# Patient Record
Sex: Male | Born: 1980 | Hispanic: Yes | Marital: Single
Health system: Southern US, Community
[De-identification: ages and names within clinical notes are randomized; demographics above are authoritative.]

## PROBLEM LIST (undated history)

## (undated) DIAGNOSIS — G43909 Migraine, unspecified, not intractable, without status migrainosus: Secondary | ICD-10-CM

## (undated) HISTORY — DX: Migraine, unspecified, not intractable, without status migrainosus: G43.909

---

## 2016-02-24 DIAGNOSIS — K2970 Gastritis, unspecified, without bleeding: Secondary | ICD-10-CM | POA: Diagnosis not present

## 2016-02-24 DIAGNOSIS — J069 Acute upper respiratory infection, unspecified: Secondary | ICD-10-CM | POA: Diagnosis not present

## 2016-02-24 DIAGNOSIS — E559 Vitamin D deficiency, unspecified: Secondary | ICD-10-CM | POA: Diagnosis not present

## 2016-07-07 DIAGNOSIS — J309 Allergic rhinitis, unspecified: Secondary | ICD-10-CM | POA: Diagnosis not present

## 2016-07-07 DIAGNOSIS — Z8669 Personal history of other diseases of the nervous system and sense organs: Secondary | ICD-10-CM | POA: Diagnosis not present

## 2016-07-07 DIAGNOSIS — H1033 Unspecified acute conjunctivitis, bilateral: Secondary | ICD-10-CM | POA: Diagnosis not present

## 2016-07-20 DIAGNOSIS — M545 Low back pain: Secondary | ICD-10-CM | POA: Diagnosis not present

## 2016-07-20 DIAGNOSIS — Z0000 Encounter for general adult medical examination without abnormal findings: Secondary | ICD-10-CM | POA: Diagnosis not present

## 2016-07-20 DIAGNOSIS — M7631 Iliotibial band syndrome, right leg: Secondary | ICD-10-CM | POA: Diagnosis not present

## 2016-07-20 DIAGNOSIS — G8929 Other chronic pain: Secondary | ICD-10-CM | POA: Diagnosis not present

## 2017-05-03 ENCOUNTER — Ambulatory Visit (INDEPENDENT_AMBULATORY_CARE_PROVIDER_SITE_OTHER): Payer: BLUE CROSS/BLUE SHIELD

## 2017-05-03 ENCOUNTER — Encounter: Payer: Self-pay | Admitting: Sports Medicine

## 2017-05-03 ENCOUNTER — Ambulatory Visit (INDEPENDENT_AMBULATORY_CARE_PROVIDER_SITE_OTHER): Payer: BLUE CROSS/BLUE SHIELD | Admitting: Sports Medicine

## 2017-05-03 DIAGNOSIS — M5136 Other intervertebral disc degeneration, lumbar region: Secondary | ICD-10-CM

## 2017-05-03 DIAGNOSIS — M4807 Spinal stenosis, lumbosacral region: Secondary | ICD-10-CM | POA: Diagnosis not present

## 2017-05-03 DIAGNOSIS — M51369 Other intervertebral disc degeneration, lumbar region without mention of lumbar back pain or lower extremity pain: Secondary | ICD-10-CM | POA: Insufficient documentation

## 2017-05-03 DIAGNOSIS — K429 Umbilical hernia without obstruction or gangrene: Secondary | ICD-10-CM

## 2017-05-03 MED ORDER — PREDNISONE 50 MG PO TABS: ORAL_TABLET | ORAL | 0 refills | Status: DC

## 2017-05-03 MED ORDER — MELOXICAM 15 MG PO TABS: ORAL_TABLET | ORAL | 3 refills | Status: DC

## 2017-05-03 NOTE — Assessment & Plan Note (Signed)
Mostly axial discogenic back pain with occasional right sided L3 and L4 radicular symptoms, he does have some weakness to the right patellar reflex. X-rays, prednisone, meloxicam, physical therapy. Return to see me in 6 weeks.

## 2017-05-03 NOTE — Progress Notes (Signed)
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  Mild weakness.  No bowel or bladder dysfunction, saddle numbness, constitutional symptoms.  Has not had any treatment, or imaging.  I reviewed the past medical history, family history, social history, surgical history, and allergies today and no changes were needed.  Please see the problem list section below in epic for further details.  Past Medical History: Past Medical History:  Diagnosis Date  . Migraines    Past Surgical History: History reviewed. No pertinent surgical history. Social History: Social History   Socioeconomic History  . Marital status: Single    Spouse name: None  . Number of children: None  . Years of education: None  . Highest education level: None  Social Needs  . Financial resource strain: None  . Food insecurity - worry: None  . Food insecurity - inability: None  . Transportation needs - medical: None  . Transportation needs - non-medical: None  Occupational History  . None  Tobacco Use  . Smoking status: Never Smoker  . Smokeless tobacco: Never Used  Substance and Sexual Activity  . Alcohol use: No    Frequency: Never  . Drug use: No  . Sexual activity: None  Other Topics Concern  . None  Social History Narrative  . None   Family History: No family history on file. Allergies: No Known Allergies Medications: See med rec.  Review of Systems: No headache, visual changes, nausea, vomiting, diarrhea, constipation, dizziness, abdominal pain, skin rash, fevers, chills, night sweats, swollen lymph nodes, weight loss, chest pain, body aches, joint swelling, muscle aches, shortness of breath, mood changes, visual or auditory hallucinations.  Objective:    General: Well Developed, well  nourished, and in no acute distress.  Neuro: Alert and oriented x3, extra-ocular muscles intact, sensation grossly intact.  HEENT: Normocephalic, atraumatic, pupils equal round reactive to light, neck supple, no masses, no lymphadenopathy, thyroid nonpalpable.  Skin: Warm and dry, no rashes noted.  Cardiac: Regular rate and rhythm, no murmurs rubs or gallops.  Respiratory: Clear to auscultation bilaterally. Not using accessory muscles, speaking in full sentences.  Abdominal: Soft, nontender, nondistended, positive bowel sounds, minimally tender umbilical hernia at the superior umbilicus at the midline, no organomegaly.  Back Exam:  Inspection: Unremarkable  Motion: Flexion 45 deg, Extension 45 deg, Side Bending to 45 deg bilaterally,  Rotation to 45 deg bilaterally  SLR laying: Negative  XSLR laying: Negative  Palpable tenderness: None. FABER: negative. Sensory change: Gross sensation intact to all lumbar and sacral dermatomes.  Reflexes: 1+ at right patellar tendon, 2+ left patellar tendon, 2+ at achilles tendons, Babinski's downgoing.  Strength at foot  Plantar-flexion: 5/5 Dorsi-flexion: 5/5 Eversion: 5/5 Inversion: 5/5  Leg strength  Quad: 5/5 Hamstring: 5/5 Hip flexor: 5/5 Hip abductors: 5/5  Gait unremarkable.  Lumbar spine x-ray reviewed, minimal endplate degenerative changes but that is about it.  Impression and Recommendations:    The patient was counselled, risk factors were discussed, anticipatory guidance given.  Umbilical hernia Referral to general surgery.  Lumbar degenerative disc disease Mostly axial discogenic back pain with occasional right sided L3 and L4 radicular symptoms, he does have some weakness to the right patellar reflex. X-rays, prednisone, meloxicam, physical therapy. Return to see me in 6 weeks. ___________________________________________  Ihor Austinhomas J. Benjamin Stainhekkekandam, M.D., ABFM., CAQSM. Primary Care and Sports Medicine [facility]  [facility]  Adjunct Instructor of Family Medicine  [employer]

## 2017-05-03 NOTE — Assessment & Plan Note (Signed)
Referral to general surgery

## 2017-05-06 ENCOUNTER — Telehealth: Payer: Self-pay | Admitting: Sports Medicine

## 2017-05-06 NOTE — Telephone Encounter (Signed)
Odd, he can see me in the office and we can figure this out rather than trying to do it over the phone...with an interpreter.

## 2017-05-06 NOTE — Telephone Encounter (Signed)
Pt had a friend call clinic as a Nurse, learning disabilitytranslator for him to advise he is having an allergic reaction to his new medications. He reports having the hiccups. He has taken both the Prednisone and Meloxicam so unsure which one could be causing this. He now is refusing to take either one.

## 2017-05-09 NOTE — Telephone Encounter (Signed)
Called pt - informed of provider's note, has agreed to make an appt for further evaluation. Pt transferred to FD for scheduling.

## 2017-05-11 ENCOUNTER — Ambulatory Visit: Payer: BLUE CROSS/BLUE SHIELD | Admitting: Rehabilitative and Restorative Service Providers"

## 2017-05-11 ENCOUNTER — Encounter: Payer: Self-pay | Admitting: Rehabilitative and Restorative Service Providers"

## 2017-05-11 DIAGNOSIS — M545 Low back pain: Secondary | ICD-10-CM

## 2017-05-11 DIAGNOSIS — R29898 Other symptoms and signs involving the musculoskeletal system: Secondary | ICD-10-CM

## 2017-05-11 DIAGNOSIS — G8929 Other chronic pain: Secondary | ICD-10-CM

## 2017-05-11 NOTE — Therapy (Signed)
[facility] [address]  [address] [place], [place], [address] Phone: [phone]   Fax:  [phone]  Physical Therapy Evaluation  Patient Details  Name: Dustin Rodgers MRN: [id number] Date of Birth: December 01, 1980 Referring Provider: Dr Benjamin Stain   Encounter Date: 05/11/2017  PT End of Session - 05/11/17 1113    Visit Number  1    Number of Visits  12    Date for PT Re-Evaluation  06/22/17    PT Start Time  1017    PT Stop Time  1120    PT Time Calculation (min)  63 min    Activity Tolerance  Patient tolerated treatment well       Past Medical History:  Diagnosis Date  . Migraines     History reviewed. No pertinent surgical history.  There were no vitals filed for this visit.   Subjective Assessment - 05/11/17 1019    Subjective  Patient reports that he started having painin the Rt hip and LB which has gradually increased over the past 3 years. He reports that symptoms are now "unbearable". He has difficulty driving to work and perfroming normal functional activities and decided to do something about the pain.     Patient is accompained by:  Interpreter [employer] interpreter service     Pertinent History  Injury to LB playing football ~ 3 yrs ago; migraine HA's ~ 5 yrs every few weeks      How long can you sit comfortably?  varies 30 min - increased with pain with driving 1-2 hours     How long can you stand comfortably?  2 hours     How long can you walk comfortably?  45 min - Rt LE swelling - running 20 min followed by walking increases LBP     Patient Stated Goals  get rid of LBP and increase activity without pain. To be able to sit to drive to work. To be able to sit.     Currently in Pain?  Yes    Pain Score  7     Pain Location  Back    Pain Orientation  Right;Lower    Pain Type  Chronic pain    Pain Radiating Towards  Rt LE to knee - at times     Pain Onset  More than a month ago    Pain Frequency  Intermittent     Aggravating Factors   prolonged sitting; prolonged standing, walking or running     Pain Relieving Factors  lying down on the floor          [facility] PT Assessment - 05/11/17 0001      Assessment   Medical Diagnosis  LBP     Referring Provider  Dr Benjamin Stain    Onset Date/Surgical Date  04/12/14    Hand Dominance  Right    Next MD Visit  to schedule     Prior Therapy  chiropractic care x 3 weeks       Precautions   Precautions  None      Balance Screen   Has the patient fallen in the past 6 months  No    Has the patient had a decrease in activity level because of a fear of falling?   No    Is the patient reluctant to leave their home because of a fear of falling?   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, bending, standing     Leisure  gym - less consistent in the past few weeks/months       Observation/Other Assessments   Focus on Therapeutic Outcomes (FOTO)   37% limitation       Posture/Postural Control   Posture Comments  head forward; shoulders rounded       AROM   Right/Left Hip  -- end range tightness bilat hips Rt > Lt     Lumbar Flexion  85%    Lumbar Extension  60% some discomfort     Lumbar - Right Side Bend  80%    Lumbar - Left Side Bend  80%    Lumbar - Right Rotation  60%    Lumbar - Left Rotation  50% pulling discomfort Rt LB area       Strength   Overall Strength Comments  5/5 bilat LE's       Flexibility   Hamstrings  tight Rt ~ 70 deg; Lt 75 deg     Quadriceps  tight Rt > Lt     ITB  tight Rt     Piriformis  tight Rt > Lt       Palpation   Spinal mobility  pain with CPA and Rt lateral mobs ~ L3     Palpation comment  muscular tightness Rt lumbar paraspinals and hip musculature including piriformis and gluts              Objective measurements completed on examination: See above findings.      [facility] Adult  PT Treatment/Exercise - 05/11/17 0001      Lumbar Exercises: Stretches   Passive Hamstring Stretch  2 reps;30 seconds supine with strap     Double Knee to Chest Stretch  3 reps;30 seconds supine       Moist Heat Therapy   Number Minutes Moist Heat  20 Minutes    Moist Heat Location  Lumbar Spine      Electrical Stimulation   Electrical Stimulation Location  Rt lumbar to Rt hip     Electrical Stimulation Action  IFC    Electrical Stimulation Parameters  to tolerance    Electrical Stimulation Goals  Tone;Pain             PT Education - 05/11/17 1111    Education provided  Yes    Education Details  HEP TENS     Person(s) Educated  Patient    Methods  Explanation;Demonstration;Tactile cues;Verbal cues;Handout    Comprehension  Verbalized understanding;Returned demonstration;Verbal cues required;Tactile cues required          PT Long Term Goals - 05/11/17 1137      PT LONG TERM GOAL #1   Title  Increase lumbar and LE mobilty and ROM to WFL's and painfree 06/22/17    Time  6    Period  Weeks    Status  New      PT LONG TERM GOAL #2   Title  Decrease pain with functional activities including driving and walking to no more than 1-2/10 per pt report 06/22/17    Time  6    Period  Weeks    Status  New      PT LONG TERM GOAL #3   Title  Patient reports returning to gym for aerobic and strength training with safe program for LB 06/22/17  Time  6    Period  Weeks    Status  New      PT LONG TERM GOAL #4   Title  Independent in HEP 06/22/17    Time  6    Period  Weeks    Status  New      PT LONG TERM GOAL #5   Title  Improve FOTO to </= 30% limitation 313/19    Time  6    Period  Weeks    Status  New             Plan - 05/11/17 1132    Clinical Impression Statement  Devaunte presents with a 3 year history of Rt hip and Rt LBP with symtpoms gradually increasing in the past few weeks/months. He has poor posture and alignment; limited trunk and LE mobility Rt  > Lt; muscular tightness to palpation Rt lumbar and posterior hip musculature; pain and limited functional abilities. {atient will benefit from PT to address problems identified.     History and Personal Factors relevant to plan of care:  Lt with ORIF Lt femur at 37 yr old     Clinical Presentation  Stable    Clinical Decision Making  Low    Rehab Potential  Good    Clinical Impairments Affecting Rehab Potential  chronic nature of symptoms     PT Frequency  2x / week    PT Duration  6 weeks    PT Treatment/Interventions  Patient/family education;ADLs/Self Care Home Management;Cryotherapy;Electrical Stimulation;Iontophoresis 4mg /ml Dexamethasone;Moist Heat;Traction;Ultrasound;Dry needling;Manual techniques;Neuromuscular re-education;Therapeutic activities;Therapeutic exercise    PT Next Visit Plan  review HEP; add stretches for hip flexors; IT band; piriformis. Deep tissue work vs DN; back care education; modalities as inbdicated     Consulted and Agree with Plan of Care  Patient       Patient will benefit from skilled therapeutic intervention in order to improve the following deficits and impairments:  Postural dysfunction, Improper body mechanics, Pain, Increased fascial restricitons, Increased muscle spasms, Decreased mobility, Decreased range of motion, Impaired UE functional use, Decreased activity tolerance  Visit Diagnosis: Chronic right-sided low back pain, with sciatica presence unspecified - Plan: PT plan of care cert/re-cert  Other symptoms and signs involving the musculoskeletal system - Plan: PT plan of care cert/re-cert     Problem List Patient Active Problem List   Diagnosis Date Noted  . Lumbar degenerative disc disease 05/03/2017  . Umbilical hernia 05/03/2017    Namiko Pritts Rober Minion PT, MPH  05/11/2017, 11:42 AM  [facility] [address] [place] [address] [place], [place], [address] Phone: [phone]   Fax:  [phone]  Name:  Seymour Pavlak MRN: [id number] Date of Birth: 1981/02/25

## 2017-05-11 NOTE — Patient Instructions (Signed)
Knee-to-Chest Stretch: Bilateral    Con las manos detrs de las rodillas, llvelas hacia el pecho hasta sentir un estiramiento cmodo en la espalada baja y los glteos. Mantenga la espalda relajada. Sostenga __30__ segundos. Repita __3__ Anthoney Haradaveces por rutina.  Realice __2__ sesiones por da.   Hamstring: Towel Stretch (Supine)    Acostado boca arriba. Coloque una toalla alrededor del pie izquierdo, flexione la rodilla y cadera a 90. Extienda la rodilla y Calpine Corporationhale el pie hacia el cuerpo. Sostenga _30__ segundos. Reljese. Repita __3_ veces. Realice _2__ veces al da. Repita con la otra pierna.  TENS UNIT: This is helpful for muscle pain and spasm.   Search and Purchase a TENS 7000 2nd edition at www.tenspros.com. It should be less than $30.     TENS unit instructions: Do not shower or bathe with the unit on Turn the unit off before removing electrodes or batteries If the electrodes lose stickiness add a drop of water to the electrodes after they are disconnected from the unit and place on plastic sheet. If you continued to have difficulty, call the TENS unit company to purchase more electrodes. Do not apply lotion on the skin area prior to use. Make sure the skin is clean and dry as this will help prolong the life of the electrodes. After use, always check skin for unusual red areas, rash or other skin difficulties. If there are any skin problems, does not apply electrodes to the same area. Never remove the electrodes from the unit by pulling the wires. Do not use the TENS unit or electrodes other than as directed. Do not change electrode placement without consultating your therapist or physician. Keep 2 fingers with between each electrode.

## 2017-05-17 ENCOUNTER — Encounter: Payer: BLUE CROSS/BLUE SHIELD | Admitting: Rehabilitative and Restorative Service Providers"

## 2017-05-17 ENCOUNTER — Ambulatory Visit (INDEPENDENT_AMBULATORY_CARE_PROVIDER_SITE_OTHER): Payer: BLUE CROSS/BLUE SHIELD | Admitting: Sports Medicine

## 2017-05-17 ENCOUNTER — Encounter: Payer: Self-pay | Admitting: Sports Medicine

## 2017-05-17 DIAGNOSIS — M5416 Radiculopathy, lumbar region: Secondary | ICD-10-CM | POA: Diagnosis not present

## 2017-05-17 MED ORDER — CELECOXIB 200 MG PO CAPS: ORAL_CAPSULE | ORAL | 2 refills | Status: AC

## 2017-05-17 NOTE — Assessment & Plan Note (Signed)
With occasional right sided L3 and L4 radicular symptoms. Did develop some dyspepsia with the prednisone and meloxicam. Discontinue these, switching to Celebrex, he will do this with meals, he will also continue to work hard with physical therapy over the next month and return to see me afterwards. He did tell me his back pain was gone on the day or 2 he was taking prednisone.

## 2017-05-17 NOTE — Progress Notes (Signed)
  Subjective:    CC: Follow-up  HPI: This is a pleasant 37 year old male, he was having right L3 and L4 lumbar radiculitis, x-rays showed multilevel lumbar degenerative disc disease.  We started prednisone, meloxicam, physical therapy, initially he had a good response with regards to his back pain but did develop some epigastric discomfort and hiccups, no melena, hematochezia, hematemesis.  He stopped the meloxicam and the prednisone.  Continuing to do PT, he has had a single session tells me he felt good during and afterwards.  I reviewed the past medical history, family history, social history, surgical history, and allergies today and no changes were needed.  Please see the problem list section below in epic for further details.  Past Medical History: Past Medical History:  Diagnosis Date  . Migraines    Past Surgical History: No past surgical history on file. Social History: Social History   Socioeconomic History  . Marital status: Single    Spouse name: None  . Number of children: None  . Years of education: None  . Highest education level: None  Social Needs  . Financial resource strain: None  . Food insecurity - worry: None  . Food insecurity - inability: None  . Transportation needs - medical: None  . Transportation needs - non-medical: None  Occupational History  . None  Tobacco Use  . Smoking status: Never Smoker  . Smokeless tobacco: Never Used  Substance and Sexual Activity  . Alcohol use: No    Frequency: Never  . Drug use: No  . Sexual activity: None  Other Topics Concern  . None  Social History Narrative  . None   Family History: No family history on file. Allergies: No Known Allergies Medications: See med rec.  Review of Systems: No fevers, chills, night sweats, weight loss, chest pain, or shortness of breath.   Objective:    General: Well Developed, well nourished, and in no acute distress.  Neuro: Alert and oriented x3, extra-ocular muscles  intact, sensation grossly intact.  HEENT: Normocephalic, atraumatic, pupils equal round reactive to light, neck supple, no masses, no lymphadenopathy, thyroid nonpalpable.  Skin: Warm and dry, no rashes. Cardiac: Regular rate and rhythm, no murmurs rubs or gallops, no lower extremity edema.  Respiratory: Clear to auscultation bilaterally. Not using accessory muscles, speaking in full sentences.  Impression and Recommendations:    Lumbar radiculopathy With occasional right sided L3 and L4 radicular symptoms. Did develop some dyspepsia with the prednisone and meloxicam. Discontinue these, switching to Celebrex, he will do this with meals, he will also continue to work hard with physical therapy over the next month and return to see me afterwards. He did tell me his back pain was gone on the day or 2 he was taking prednisone.  Interview conducted with video interpreter.  I spent 25 minutes with this patient, greater than 50% was face-to-face time counseling regarding the above diagnoses ___________________________________________ Ihor Austinhomas J. Benjamin Stainhekkekandam, M.D., ABFM., CAQSM. Primary Care and Sports Medicine  [facility]  Adjunct Instructor of Family Medicine  [employer]

## 2017-05-18 ENCOUNTER — Ambulatory Visit: Payer: BLUE CROSS/BLUE SHIELD | Admitting: Physical Therapy

## 2017-05-18 DIAGNOSIS — G8929 Other chronic pain: Secondary | ICD-10-CM | POA: Diagnosis not present

## 2017-05-18 DIAGNOSIS — R29898 Other symptoms and signs involving the musculoskeletal system: Secondary | ICD-10-CM

## 2017-05-18 DIAGNOSIS — M545 Low back pain: Secondary | ICD-10-CM | POA: Diagnosis not present

## 2017-05-18 NOTE — Therapy (Signed)
[facility] [place] [address] [place] [address] [place], [place], [address] Phone: [phone]   Fax:  [phone]  Physical Therapy Treatment  Patient Details  Name: Dustin Rodgers MRN: [id number] Date of Birth: 06/07/1980 Referring Provider: Dr. Benjamin Stainhekkekandam   Encounter Date: 05/18/2017  PT End of Session - 05/18/17 1034    Visit Number  2    Number of Visits  12    Date for PT Re-Evaluation  06/22/17    PT Start Time  1030    PT Stop Time  1132    PT Time Calculation (min)  62 min    Activity Tolerance  Patient tolerated treatment well;No increased pain    Behavior During Therapy  WFL for tasks assessed/performed       Past Medical History:  Diagnosis Date  . Migraines     No past surgical history on file.  There were no vitals filed for this visit.  Subjective Assessment - 05/18/17 1034    Subjective  Pt reports he has been doing stretches every day.  He is able to move his Rt leg more with stretches. He feels less pain/numbness in RLE after stretch.     Patient is accompained by:  Interpreter Myrna [phone]    Patient Stated Goals  get rid of LBP and increase activity without pain. To be able to sit to drive to work. To be able to sit.     Currently in Pain?  Yes    Pain Score  4     Pain Location  Back    Pain Orientation  Right;Lower    Pain Descriptors / Indicators  Aching         [facility] PT Assessment - 05/18/17 0001      Assessment   Medical Diagnosis  LBP     Referring Provider  Dr. Benjamin Stainhekkekandam    Onset Date/Surgical Date  04/12/14    Hand Dominance  Right    Next MD Visit  to schedule       Flexibility   Hamstrings  Rt 90 deg.        [facility] Adult PT Treatment/Exercise - 05/18/17 0001      Lumbar Exercises: Stretches   Passive Hamstring Stretch  2 reps;60 seconds supine with strap     Double Knee to Chest Stretch  2 reps;30 seconds    Standing Extension  2 reps;10 reps    Prone on Elbows Stretch  1 rep;60  seconds    Press Ups  5 reps;5 seconds    Piriformis Stretch  Right;Left;2 reps;30 seconds    Figure 4 Stretch  30 seconds;1 rep;With overpressure;Supine      Lumbar Exercises: Aerobic   Nustep  L5:4 min       Lumbar Exercises: Standing   Other Standing Lumbar Exercises  box lift from shoulder height shelf to waist high, core engaged, VC on form/technique x 2 reps.       Lumbar Exercises: Seated   Sit to Stand  10 reps core engaged.     Other Seated Lumbar Exercises  Lap press with core engaged x 5 sec hold x 10 reps; tactile cues to engage.       Moist Heat Therapy   Number Minutes Moist Heat  20 Minutes    Moist Heat Location  Lumbar Spine      Electrical Stimulation   Electrical Stimulation Location  Rt lumbar to Rt hip    Electrical Stimulation Action  IFC  Electrical Stimulation Parameters  to tolerance     Electrical Stimulation Goals  Pain             PT Education - 05/18/17 1059    Education provided  Yes    Education Details  Posture and body mechanics; added prone on elbows to HEP.     Person(s) Educated  Patient    Methods  Explanation;Handout    Comprehension  Verbalized understanding          PT Long Term Goals - 05/18/17 1126      PT LONG TERM GOAL #1   Title  Increase lumbar and LE mobilty and ROM to WFL's and painfree 06/22/17    Time  6    Period  Weeks    Status  On-going      PT LONG TERM GOAL #2   Title  Decrease pain with functional activities including driving and walking to no more than 1-2/10 per pt report 06/22/17    Time  6    Period  Weeks    Status  On-going      PT LONG TERM GOAL #3   Title  Patient reports returning to gym for aerobic and strength training with safe program for LB 06/22/17    Time  6    Period  Weeks    Status  On-going      PT LONG TERM GOAL #4   Title  Independent in HEP 06/22/17    Time  6    Period  Weeks    Status  On-going      PT LONG TERM GOAL #5   Title  Improve FOTO to </= 30% limitation  313/19    Time  6    Period  Weeks    Status  On-going            Plan - 05/18/17 1127    Clinical Impression Statement  Pt reporting decreased radicular symptoms with hamstring stretches.  He reported increased pain with knees to chest and fig 4 hip stretch, reduced with prone press ups.  Pt educated on core engagement with lifting / bending at work; returned demo and reported decrease in LBP.  Encouraged pt to hold off on gym exercise (especially running and LE weights) until back symptoms reduced.  Pt progressing towards therapy goals.     PT Frequency  2x / week    PT Duration  6 weeks    PT Treatment/Interventions  Patient/family education;ADLs/Self Care Home Management;Cryotherapy;Electrical Stimulation;Iontophoresis 4mg /ml Dexamethasone;Moist Heat;Traction;Ultrasound;Dry needling;Manual techniques;Neuromuscular re-education;Therapeutic activities;Therapeutic exercise    PT Next Visit Plan  continue core strengthening and LE stretching.  Manual therapy to hips/low back.     Consulted and Agree with Plan of Care  Patient       Patient will benefit from skilled therapeutic intervention in order to improve the following deficits and impairments:  Postural dysfunction, Improper body mechanics, Pain, Increased fascial restricitons, Increased muscle spasms, Decreased mobility, Decreased range of motion, Impaired UE functional use, Decreased activity tolerance  Visit Diagnosis: Chronic right-sided low back pain, with sciatica presence unspecified  Other symptoms and signs involving the musculoskeletal system     Problem List Patient Active Problem List   Diagnosis Date Noted  . Lumbar radiculopathy 05/17/2017  . Umbilical hernia 05/03/2017   Mayer Camel, PTA 05/18/17 11:32 AM  [facility] [place] [address] [place], [place], [address] Phone: [phone]   Fax:  [phone]  Name: Dustin  Bradly Rodgers MRN:  [id number] Date of Birth: [birth year]

## 2017-05-18 NOTE — Patient Instructions (Addendum)
Recuerda apretar tus abdominales cuando ests de pie o levantando objetos.  On Elbows (Prone)    Rise up on elbows as high as possible, keeping hips on floor. Hold __3__ seconds. Repeat _5___ times per set.Do __2__ sessions per day.    Sleeping on Back    Coloque una almohada debajo de las rodillas. Dustin GilmoreUna almohada con soporte cervical y un rollo alrededor de la cintura son tambin de [place]gran ayuda.   Sleeping on Side    Coloque una PepsiCoalmohada entre las rodillas. Utilice soporte cervical abajo del cuello y un rollo alrededor de la cintura si lo necesita.  Sleeping on Stomach    Si sta es la nica posicin en la que puede dormir, coloque una almohada debajo de la parte inferior de las piernas y otra debajo del estmago o del pecho segn necesidad.  Posture Awareness    Prese y chequee la postura: Saque el mentn, llvelo [place] atrs, muvalo hacia una posicin media confortable. Deslice la pelvis hacia adelante, hacia atrs, asegrese de que la espalda no est arqueada. Desplace el peso desde los General Electrictalones hacia las puntas de los pies, luego distribuya el peso parejo. Imagnese una lnea que le atraviesa la columna y tira [place] arriba, enderezndolo. Concntrese en la respiracin. Buena postura = Mejor respiracin.   Posture - Sitting    Sintese derecho, con la cabeza mirando hacia adelante. Intente colocar un rollo detrs de la cintura para sostener la columna lumbar. Mantenga los hombros relajados, evite redondear la espalda. Mantenga las caderas niveladas con las rodillas. Evite cruzar las piernas por perodos prolongados.

## 2017-05-25 ENCOUNTER — Ambulatory Visit: Payer: BLUE CROSS/BLUE SHIELD | Admitting: Physical Therapy

## 2017-05-25 DIAGNOSIS — M545 Low back pain: Secondary | ICD-10-CM | POA: Diagnosis not present

## 2017-05-25 DIAGNOSIS — R29898 Other symptoms and signs involving the musculoskeletal system: Secondary | ICD-10-CM

## 2017-05-25 DIAGNOSIS — G8929 Other chronic pain: Secondary | ICD-10-CM

## 2017-05-25 NOTE — Patient Instructions (Signed)
  Knee Fold    De espaldas, con las piernas dobladas y los brazos a los lados. Espire, levantando la rodilla Tech Data Corporationhacia el pecho. Inspire al regresar. Mantenga los abdominales planos y el ombligo hacia la columna. Repita _10__ veces, alternando las piernas. Haga _1-2__ sesiones diarias.  Arm / Leg Lift: Opposite (Prone)    Eleve la pierna izquierda y el brazo opuesto __2__ cm. del piso, mantenga la rodilla extendida. Repita __10__ veces por rutina. Realice _2___ Carlis Stablerutinas por sesin. Realice _1___ sesiones por da.   Bridge    [place] espaldas, piernas dobladas. Inspire presionando con las caderas [place]acia arriba. Mantenga las Reynolds Americancostillas hacia adentro, extienda la espalda inferior. Espire rodando Intelsobre la columna desde Seychellesarriba. Repita _10__ veces. Haga _1-2__ sesiones diarias.    [facility] [address] [place], [place] [address]  [phone] (office) [phone] (fax)

## 2017-05-25 NOTE — Therapy (Signed)
[facility] [place] [address] [place] [address] [place], [place], [address] Phone: [phone]   Fax:  [phone]  Physical Therapy Treatment  Patient Details  Name: Daimon Kean MRN: [id number] Date of Birth: 12-29-1980 Referring Provider: Dr. Benjamin Stain   Encounter Date: 05/25/2017  PT End of Session - 05/25/17 1023    Visit Number  3    Number of Visits  12    Date for PT Re-Evaluation  06/22/17    PT Start Time  1018    PT Stop Time  1113    PT Time Calculation (min)  55 min    Activity Tolerance  Patient tolerated treatment well    Behavior During Therapy  [facility] for tasks assessed/performed       Past Medical History:  Diagnosis Date  . Migraines     No past surgical history on file.  There were no vitals filed for this visit.  Subjective Assessment - 05/25/17 1024    Subjective  Pt reports he is feeling a little better. He has been stretching 2x/day, using Ice and taking medicine.  He has been tightening his stomach with work activities (lifting/ bending).  He has held off on running or lifting at gym.    Currently in Pain?  Yes    Pain Score  3     Pain Location  Back    Pain Orientation  Right    Pain Descriptors / Indicators  Aching    Pain Radiating Towards  Rt LE to knee, sometimes lateral foot.     Aggravating Factors   lifting, squating, prolonged sitting    Pain Relieving Factors  medicine, stretches         [facility] PT Assessment - 05/25/17 0001      Assessment   Medical Diagnosis  LBP     Referring Provider  Dr. Benjamin Stain    Onset Date/Surgical Date  04/12/14    Hand Dominance  Right    Next MD Visit  to schedule       Flexibility   Hamstrings  Rt 87, Lt 90 deg.                   [facility] Adult PT Treatment/Exercise - 05/25/17 0001      Self-Care   Self-Care  Other Self-Care Comments    Other Self-Care Comments   Pt educated on TENS safety parameters and application; pt verbalized  understanding.       Lumbar Exercises: Stretches   Passive Hamstring Stretch  60 seconds;3 reps supine with strap     Press Ups  5 seconds;10 reps    Piriformis Stretch  Right;Left;30 seconds;3 reps    Figure 4 Stretch  2 reps;20 seconds      Lumbar Exercises: Aerobic   Nustep  L5:5 min  PTA present to discuss progress      Lumbar Exercises: Supine   Ab Set  5 reps;5 seconds    Bent Knee Raise  10 reps with ab set; multiple cues to engage.     Bridge  10 reps;3 seconds core engaged.       Lumbar Exercises: Prone   Opposite Arm/Leg Raise  Right arm/Left leg;Left arm/Right leg;10 reps;2 seconds      Moist Heat Therapy   Number Minutes Moist Heat  15 Minutes    Moist Heat Location  Lumbar Spine      Electrical Stimulation   Electrical Stimulation Location  Rt lumbar to Rt hip  Electrical Stimulation Action  IFC    Electrical Stimulation Parameters  to tolerance     Electrical Stimulation Goals  Pain;Tone             PT Education - 05/25/17 1235    Education provided  Yes    Education Details  HEP    Person(s) Educated  Patient    Methods  Explanation;Handout;Verbal cues;Tactile cues;Demonstration    Comprehension  Verbalized understanding          PT Long Term Goals - 05/25/17 1223      PT LONG TERM GOAL #1   Title  Increase lumbar and LE mobilty and ROM to WFL's and painfree 06/22/17    Period  Weeks    Status  On-going improving      PT LONG TERM GOAL #2   Title  Decrease pain with functional activities including driving and walking to no more than 1-2/10 per pt report 06/22/17    Time  6    Period  Weeks    Status  On-going up to 4-5/10 with functional activities      PT LONG TERM GOAL #3   Title  Patient reports returning to gym for aerobic and strength training with safe program for LB 06/22/17    Time  6    Period  Weeks    Status  On-going      PT LONG TERM GOAL #4   Title  Independent in HEP 06/22/17    Time  6    Period  Weeks    Status   On-going      PT LONG TERM GOAL #5   Title  Improve FOTO to </= 30% limitation 313/19    Time  6    Period  Weeks    Status  On-going            Plan - 05/25/17 1231    Clinical Impression Statement  Pt reporting overall decrease in pain in back.  Positive response with stretches and heat thus far.  He tolerated all exercises without increase in pain. Progressing well towards stated therapy goals.     Rehab Potential  Good    PT Frequency  2x / week    PT Duration  6 weeks    PT Treatment/Interventions  Patient/family education;ADLs/Self Care Home Management;Cryotherapy;Electrical Stimulation;Iontophoresis 4mg /ml Dexamethasone;Moist Heat;Traction;Ultrasound;Dry needling;Manual techniques;Neuromuscular re-education;Therapeutic activities;Therapeutic exercise    PT Next Visit Plan  continue core strengthening and LE stretching.  Manual therapy to hips/low back if indicated.   educate on self massage techniques.     Consulted and Agree with Plan of Care  Patient       Patient will benefit from skilled therapeutic intervention in order to improve the following deficits and impairments:  Postural dysfunction, Improper body mechanics, Pain, Increased fascial restricitons, Increased muscle spasms, Decreased mobility, Decreased range of motion, Impaired UE functional use, Decreased activity tolerance  Visit Diagnosis: Chronic right-sided low back pain, with sciatica presence unspecified  Other symptoms and signs involving the musculoskeletal system     Problem List Patient Active Problem List   Diagnosis Date Noted  . Lumbar radiculopathy 05/17/2017  . Umbilical hernia 05/03/2017   Mayer CamelJennifer Carlson-Long, PTA 05/25/17 12:35 PM  [facility] [place] [address] [place] [address] [place], [place], [address] Phone: [phone]   Fax:  [phone]  Name: Debbra RidingHoracio Martinez MRN: [id number] Date of Birth: 12/02/1980

## 2017-06-08 ENCOUNTER — Ambulatory Visit: Payer: BLUE CROSS/BLUE SHIELD | Admitting: Physical Therapy

## 2017-06-08 DIAGNOSIS — M545 Low back pain: Secondary | ICD-10-CM | POA: Diagnosis not present

## 2017-06-08 DIAGNOSIS — R29898 Other symptoms and signs involving the musculoskeletal system: Secondary | ICD-10-CM

## 2017-06-08 DIAGNOSIS — G8929 Other chronic pain: Secondary | ICD-10-CM | POA: Diagnosis not present

## 2017-06-08 NOTE — Patient Instructions (Addendum)
KNEE: Quadriceps - Prone    Place strap around ankle. Bring ankle toward buttocks. Press hip into surface. Hold __30_ seconds. _2__ reps per set, __1-2_ sets per day,  Sleeping on Back    Coloque una almohada debajo de las rodillas. Viona Gilmore con soporte cervical y un rollo alrededor de la cintura son tambin de [place].   Sleeping on Side    Coloque una PepsiCo. Utilice soporte cervical abajo del cuello y un rollo alrededor de la cintura si lo necesita.  Sleeping on Stomach    Si sta es la nica posicin en la que puede dormir, coloque una almohada debajo de la parte inferior de las piernas y otra debajo del estmago o del pecho segn necesidad.  Posture Awareness    Prese y chequee la postura: Saque el mentn, llvelo [place] atrs, muvalo hacia una posicin media confortable. Deslice la pelvis hacia adelante, hacia atrs, asegrese de que la espalda no est arqueada. Desplace el peso desde los [employer] puntas de los pies, luego distribuya el peso parejo. Imagnese una lnea que le atraviesa la columna y tira [place] arriba, enderezndolo. Concntrese en la respiracin. Buena postura = Mejor respiracin.   Posture - Sitting    Sintese derecho, con la cabeza mirando hacia adelante. Intente colocar un rollo detrs de la cintura para sostener la columna lumbar. Mantenga los hombros relajados, evite redondear la espalda. Mantenga las caderas niveladas con las rodillas. Evite cruzar las piernas por perodos prolongados.  Sleeping on Back  Place pillow under knees. A pillow with cervical support and a roll around waist are also helpful. Copyright  VHI. All rights reserved.  Sleeping on Side Place pillow between knees. Use cervical support under neck and a roll around waist as needed. Copyright  VHI. All rights reserved.   Sleeping on Stomach   If this is the only desirable sleeping position, place pillow under lower legs, and under  stomach or chest as needed.  Posture - Sitting   Sit upright, head facing forward. Try using a roll to support lower back. Keep shoulders relaxed, and avoid rounded back. Keep hips level with knees. Avoid crossing legs for long periods. Stand to Sit / Sit to Stand   To sit: Bend knees to lower self onto front edge of chair, then scoot back on seat. To stand: Reverse sequence by placing one foot forward, and scoot to front of seat. Use rocking motion to stand up.   Work Height and Reach  Ideal work height is no more than 2 to 4 inches below elbow level when standing, and at elbow level when sitting. Reaching should be limited to arm's length, with elbows slightly bent.  Bending  Bend at hips and knees, not back. Keep feet shoulder-width apart.    Posture - Standing   Good posture is important. Avoid slouching and forward head thrust. Maintain curve in low back and align ears over shoul- ders, hips over ankles.  Alternating Positions   Alternate tasks and change positions frequently to reduce fatigue and muscle tension. Take rest breaks. Computer Work   Position work to Art gallery manager. Use proper work and seat height. Keep shoulders back and down, wrists straight, and elbows at right angles. Use chair that provides full back support. Add footrest and lumbar roll as needed.  Getting Into / Out of Car  Lower self onto seat, scoot back, then bring in one leg at a time. Reverse sequence to get out.  Dressing  Lie on back to pull socks or slacks over feet, or sit and bend leg while keeping back straight.    Housework - Sink  Place one foot on ledge of cabinet under sink when standing at sink for prolonged periods.   Pushing / Pulling  Pushing is preferable to pulling. Keep back in proper alignment, and use leg muscles to do the work.  Deep Squat   Squat and lift with both arms held against upper trunk. Tighten stomach muscles without holding breath. Use smooth  movements to avoid jerking.  Avoid Twisting   Avoid twisting or bending back. Pivot around using foot movements, and bend at knees if needed when reaching for articles.  Carrying Luggage   Distribute weight evenly on both sides. Use a cart whenever possible. Do not twist trunk. Move body as a unit.   Lifting Principles .Maintain proper posture and head alignment. .Slide object as close as possible before lifting. .Move obstacles out of the way. .Test before lifting; ask for help if too heavy. .Tighten stomach muscles without holding breath. .Use smooth movements; do not jerk. .Use legs to do the work, and pivot with feet. .Distribute the work load symmetrically and close to the center of trunk. .Push instead of pull whenever possible.   Ask For Help   Ask for help and delegate to others when possible. Coordinate your movements when lifting together, and maintain the low back curve.  Log Roll   Lying on back, bend left knee and place left arm across chest. Roll all in one movement to the right. Reverse to roll to the left. Always move as one unit. Housework - Sweeping  Use long-handled equipment to avoid stooping.   Housework - Wiping  Position yourself as close as possible to reach work surface. Avoid straining your back.  Laundry - Unloading Wash   To unload small items at bottom of washer, lift leg opposite to arm being used to reach.  Gardening - Raking  Move close to area to be raked. Use arm movements to do the work. Keep back straight and avoid twisting.     Cart  When reaching into cart with one arm, lift opposite leg to keep back straight.   Getting Into / Out of Bed  Lower self to lie down on one side by raising legs and lowering head at the same time. Use arms to assist moving without twisting. Bend both knees to roll onto back if desired. To sit up, start from lying on side, and use same move-ments in reverse. Housework - Vacuuming  Hold the  vacuum with arm held at side. Step back and forth to move it, keeping head up. Avoid twisting.   Laundry - Armed forces training and education officerLoading Wash  Position laundry basket so that bending and twisting can be avoided.   Laundry - Unloading Dryer  Squat down to reach into clothes dryer or use a reacher.  Gardening - Weeding / Psychiatric nurselanting  Squat or Kneel. Knee pads may be helpful.                     [facility] [place] [address] [place] [address] [place], [place] [address]  [phone])

## 2017-06-08 NOTE — Therapy (Signed)
[place], [place], [address] Phone: [phone]   Fax:  [phone]  Physical Therapy Treatment  Patient Details  Name: Dustin Rodgers MRN: [id number] Date of Birth: [birth year] Referring Provider: Dr. Dianah Field   Encounter Date: 06/08/2017  PT End of Session - 06/08/17 1050    Visit Number  4    Number of Visits  12    Date for PT Re-Evaluation  06/22/17    PT Start Time  1020    PT Stop Time  1109    PT Time Calculation (min)  49 min    Activity Tolerance  Patient tolerated treatment well    Behavior During Therapy  [facility] for tasks assessed/performed       Past Medical History:  Diagnosis Date  . Migraines     No past surgical history on file.  There were no vitals filed for this visit.  Subjective Assessment - 06/08/17 1022    Subjective  Pt reports he has been using bike, treadmill  to walk, and recumbent bike. He had to lift heavy boxes at work on Monday and the pain returned.  He no longer has numbness in his leg.     Patient is accompained by:  Interpreter Tiburcio Bash    Currently in Pain?  Yes    Pain Score  4     Pain Location  Back    Pain Orientation  Right    Pain Descriptors / Indicators  Dull;Aching    Pain Radiating Towards  to Rt thigh    Aggravating Factors   lifting, squating     Pain Relieving Factors  medicine, ice.          [facility] PT Assessment - 06/08/17 0001      Assessment   Medical Diagnosis  LBP     Referring Provider  Dr. Dianah Field    Onset Date/Surgical Date  04/12/14    Hand Dominance  Right    Next MD Visit  to schedule       Palpation   Palpation comment  Rt ASIS lower than Lt.  Rt sacral torsion       [facility] Adult PT Treatment/Exercise - 06/08/17 0001      Self-Care   Other Self-Care Comments   pt educated on posture and body mechanics - issued handout also.       Lumbar Exercises: Stretches   Passive Hamstring Stretch   Right;Left;2 reps 45 seconds.     Quad Stretch  Right;Left;2 reps;30 seconds    Piriformis Stretch  Right;Left;30 seconds;3 reps      Lumbar Exercises: Aerobic   Recumbent Bike  L2: 5.5 min  PTA present to discuss progress      Lumbar Exercises: Standing   Other Standing Lumbar Exercises  squats with core engaged and dowel against back (for tactile cue of straight back) x 5 reps       Lumbar Exercises: Seated   Sit to Stand  5 reps core engaged.       Lumbar Exercises: Supine   Bridge  10 reps core engaged.       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               PT Education - 06/08/17 1252    Education provided  Yes    Education Details  HEP - added quad stretch; posture and body mechanice (spanish and english)    Person(s) Educated  Patient through interpreter    Methods  Explanation;Handout    Comprehension  Verbalized understanding          PT Long Term Goals - 05/25/17 1223      PT LONG TERM GOAL #1   Title  Increase lumbar and LE mobilty and ROM to WFL's and painfree 06/22/17    Period  Weeks    Status  On-going improving      PT LONG TERM GOAL #2   Title  Decrease pain with functional activities including driving and walking to no more than 1-2/10 per pt report 06/22/17    Time  6    Period  Weeks    Status  On-going up to 4-5/10 with functional activities      PT LONG TERM GOAL #3   Title  Patient reports returning to gym for aerobic and strength training with safe program for LB 06/22/17    Time  6    Period  Weeks    Status  On-going      PT LONG TERM GOAL #4   Title  Independent in HEP 06/22/17    Time  6    Period  Weeks    Status  On-going      PT LONG TERM GOAL #5    Title  Improve FOTO to </= 30% limitation 313/19    Time  6    Period  Weeks    Status  On-going            Plan - 06/08/17 1256    Clinical Impression Statement  Pt reporting decreased RLE radicular symptoms.  He had flare up of LBP with lifting boxes overhead at work.  His pelvis had some asymmetries; improved with MET corrections. Pt tolerated all exercises in sesion well.  Progressing well towards therapy goals.     Rehab Potential  Good    PT Frequency  2x / week    PT Duration  6 weeks    PT Treatment/Interventions  Patient/family education;ADLs/Self Care Home Management;Cryotherapy;Electrical Stimulation;Iontophoresis 64m/ml Dexamethasone;Moist Heat;Traction;Ultrasound;Dry needling;Manual techniques;Neuromuscular re-education;Therapeutic activities;Therapeutic exercise    PT Next Visit Plan  continue core strengthening and LE stretching.  Manual therapy to hips/low back if indicated.      Consulted and Agree with Plan of Care  Patient       Patient will benefit from skilled therapeutic intervention in order to improve the following deficits and impairments:  Postural dysfunction, Improper body mechanics, Pain, Increased fascial restricitons, Increased muscle spasms, Decreased mobility, Decreased range of motion, Impaired UE functional use, Decreased activity tolerance  Visit Diagnosis: Chronic right-sided low back pain, with sciatica presence unspecified  Other symptoms and signs involving the musculoskeletal system     Problem List Patient Active Problem List   Diagnosis Date Noted  . Lumbar radiculopathy 05/17/2017  . Umbilical hernia [id number]/41/7408  JKerin Perna PTA 06/08/17 12:58 PM  [place] [address]Phone: [phone]  Fax:  [phone] Name: Dustin RehbergMRN: [id number] of Birth: [birth year]

## 2017-06-14 ENCOUNTER — Ambulatory Visit: Payer: BLUE CROSS/BLUE SHIELD | Admitting: Sports Medicine

## 2017-06-14 DIAGNOSIS — Z0189 Encounter for other specified special examinations: Secondary | ICD-10-CM

## 2017-06-22 ENCOUNTER — Encounter: Payer: Self-pay | Admitting: Physical Therapy

## 2017-06-22 ENCOUNTER — Ambulatory Visit: Payer: BLUE CROSS/BLUE SHIELD | Admitting: Physical Therapy

## 2017-06-22 DIAGNOSIS — G8929 Other chronic pain: Secondary | ICD-10-CM

## 2017-06-22 DIAGNOSIS — M545 Low back pain: Secondary | ICD-10-CM | POA: Diagnosis not present

## 2017-06-22 DIAGNOSIS — R29898 Other symptoms and signs involving the musculoskeletal system: Secondary | ICD-10-CM

## 2017-06-22 NOTE — Patient Instructions (Addendum)
Bridging (Single Leg)    Acustese sobre su espalda con los pies separados el ancho de sus hombros y la pierna derecha estirada. Levante las caderas hacia el techo mientras mantiene la pierna estirada. Mantenga __5__ segundos. Repita _10___ veces. Haga _1___ sesiones por da.  Bird Dog Pose - Intermediate    Keep pelvis stable. From table pose, step one leg back to plank pose. Reach opposite arm forward, back foot off floor.  Hold for __5__ breaths. Return arm and leg at same rate. Repeat __10__ times, alternating sides.  Scapula Adduction With Pectorals, Low   Stand in doorframe with palms against frame and arms at 45. Lean forward and squeeze shoulder blades. Hold ___ seconds. Repeat ___ times per session. Do ___ sessions per day.    Scapula Adduction With Pectorals, Mid-Range   Stand in doorframe with palms against frame and arms at 90. Lean forward and squeeze shoulder blades. Hold _30__ seconds. Repeat __2_ times per session. Do _2__ sessions per day.  \Scapula Adduction With Pectorals, High   Stand in doorframe with palms against frame and arms at 120. Lean forward and squeeze shoulder blades. Hold __30_ seconds. Repeat _2__ times per session. Do __2_ sessions per day. TENS UNIT  This is helpful for muscle pain and spasm.   Search and Purchase a TENS 7000 2nd edition at  [employer]  (It should be less than $30)     TENS unit instructions:   Do not shower or bathe with the unit on  Turn the unit off before removing electrodes or batteries  If the electrodes lose stickiness add a drop of water to the electrodes after they are disconnected from the unit and place on plastic sheet. If you continued to have difficulty, call the TENS unit company to purchase more electrodes.  Do not apply lotion on the skin area prior to use. Make sure the skin is clean and dry as this will help prolong the life of the electrodes.  After use, always check skin for unusual red  areas, rash or other skin difficulties. If there are any skin problems, does not apply electrodes to the same area.  Never remove the electrodes from the unit by pulling the wires.  Do not use the TENS unit or electrodes other than as directed.  Do not change electrode placement without consulting your therapist or physician.  Keep 2 fingers with between each electrode.   [facility] [address] [place], [place] [address]  [phone] (office) [phone] (fax)

## 2017-06-22 NOTE — Therapy (Addendum)
[place], [place], [address] Phone: [phone]   Fax:  [phone]  Physical Therapy Treatment  Patient Details  Name: Dustin Rodgers MRN: [id number] Date of Birth: Oct 30, 1980 Referring Provider: Dr. Dianah Field   Encounter Date: 06/22/2017  PT End of Session - 06/22/17 1454    Visit Number  5    Number of Visits  12    Date for PT Re-Evaluation  06/22/17    PT Start Time  1829 pt arrived late    PT Stop Time  1534    PT Time Calculation (min)  55 min    Activity Tolerance  Patient tolerated treatment well;No increased pain    Behavior During Therapy  WFL for tasks assessed/performed       Past Medical History:  Diagnosis Date  . Migraines     History reviewed. No pertinent surgical history.  There were no vitals filed for this visit.  Subjective Assessment - 06/22/17 1442    Subjective  Pt reports he is using his abdominal bracing when he is working; this has helped. He is only walking and using eliptical.  His pain only gets up to 5/10 at most during work day.  He would like to learn new exercises.     Patient is accompained by:  Interpreter [place]    Currently in Pain?  Yes    Pain Score  2     Pain Location  Back    Pain Orientation  Right    Aggravating Factors   lifting, working bent over (kitchen prep work)    Pain Relieving Factors  medicine, ice.          [facility] PT Assessment - 06/22/17 0001      Assessment   Medical Diagnosis  LBP     Referring Provider  Dr. Dianah Field    Onset Date/Surgical Date  04/12/14    Hand Dominance  Right    Next MD Visit  to schedule       Observation/Other Assessments   Focus on Therapeutic Outcomes (FOTO)   25% limited       AROM   Lumbar Flexion  WNL    Lumbar Extension  WNL    Lumbar - Right Side Bend  WNL    Lumbar - Left Side Bend  WNL    Lumbar - Right Rotation  WNL    Lumbar - Left Rotation  WNL         [facility] Adult PT Treatment/Exercise - 06/22/17 0001      Lumbar Exercises: Stretches   Passive Hamstring Stretch  Left;Right;3 reps;30 seconds    Prone on Elbows Stretch  1 rep;60 seconds    Press Ups  5 seconds;10 reps    Piriformis Stretch  Right;Left;30 seconds;3 reps      Lumbar Exercises: Aerobic   Elliptical  L2: 5.5 min  PTA present to discuss progress      Lumbar Exercises: Standing   Other Standing Lumbar Exercises  squats with core engaged and dowel against back (for tactile cue of straight back) x 5 reps       Lumbar Exercises: Supine   Single Leg Bridge  10 reps;5 seconds core engaged and fig 4.       Lumbar Exercises: Quadruped   Opposite Arm/Leg Raise  Right arm/Left leg;Left arm/Right leg;10 reps;5 seconds      Modalities   Modalities  Electrical Stimulation;Moist Heat      Moist  Heat Therapy   Number Minutes Moist Heat  15 Minutes    Moist Heat Location  Lumbar Spine      Electrical Stimulation   Electrical Stimulation Location  Rt lumbar to Rt hip    Electrical Stimulation Action  IFC    Electrical Stimulation Parameters  to tolerance    Electrical Stimulation Goals  Pain;Tone             PT Education - 06/22/17 1533    Education provided  Yes    Education Details  HEP, TENS info    Person(s) Educated  Patient    Methods  Explanation;Handout;Verbal cues;Tactile cues;Demonstration    Comprehension  Verbalized understanding;Returned demonstration          PT Long Term Goals - 06/22/17 1454      PT LONG TERM GOAL #1   Title  Increase lumbar and LE mobilty and ROM to WFL's and painfree 06/22/17    Time  6    Period  Weeks    Status  On-going      PT LONG TERM GOAL #2   Title  Decrease pain with functional activities including driving and walking to no more than 1-2/10 per pt report 06/22/17    Status  Partially Met up to 4/10 with walking, 0/10 with driving      PT LONG TERM GOAL #3   Title  Patient reports returning to gym for aerobic  and strength training with safe program for LB 06/22/17    Time  6    Period  Weeks    Status  Partially Met      PT LONG TERM GOAL #4   Title  Independent in HEP 06/22/17    Time  6    Period  Weeks    Status  On-going      PT LONG TERM GOAL #5   Title  Improve FOTO to </= 30% limitation 313/19    Time  6    Period  Weeks    Status  Achieved           Plan - 06/22/17 1521    Clinical Impression Statement  Pt reporting 50% reduction in symptoms.  He has met his FOTO goal.  He has demonstrated improved lumbar mobility; met LTG#1.  He no longer has symptoms with driving, however he reports up to 4/10 pain with walking longer distances in community, or prolonged postures during work. Pt has made good progress thus far.  Pt has partially met his goals and requests to hold therapy while he returns to MD.     Rehab Potential  Good    PT Frequency  2x / week    PT Duration  6 weeks    PT Treatment/Interventions  Patient/family education;ADLs/Self Care Home Management;Cryotherapy;Electrical Stimulation;Iontophoresis 86m/ml Dexamethasone;Moist Heat;Traction;Ultrasound;Dry needling;Manual techniques;Neuromuscular re-education;Therapeutic activities;Therapeutic exercise    PT Next Visit Plan  Hold therapy until after MD appt.  Will req additional visits after his return.     Consulted and Agree with Plan of Care  Patient       Patient will benefit from skilled therapeutic intervention in order to improve the following deficits and impairments:  Postural dysfunction, Improper body mechanics, Pain, Increased fascial restricitons, Increased muscle spasms, Decreased mobility, Decreased range of motion, Impaired UE functional use, Decreased activity tolerance  Visit Diagnosis: Chronic right-sided low back pain, with sciatica presence unspecified  Other symptoms and signs involving the musculoskeletal system     Problem List Patient  Active Problem List   Diagnosis Date Noted  . Lumbar  radiculopathy 05/17/2017  . Umbilical hernia 61/22/4497   Kerin Perna, PTA 06/22/17 5:18 PM  [facility] [place], [place], [address] Phone: [phone]   Fax:  [phone]  Name: Dustin Rodgers MRN: [id number] Date of Birth: November 21, 1980  PHYSICAL THERAPY DISCHARGE SUMMARY  Visits from Start of Care: 5  Current functional level related to goals / functional outcomes: See progress note for discharge status   Remaining deficits: unknown   Education / Equipment: HEP  Plan: Patient agrees to discharge.  Patient goals were met. Patient is being discharged due to meeting the stated rehab goals.  ?????     Celyn P. Helene Kelp PT, MPH 08/04/17 10:51 AM

## 2017-06-23 ENCOUNTER — Encounter: Payer: BLUE CROSS/BLUE SHIELD | Admitting: Physical Therapy

## 2017-06-29 ENCOUNTER — Encounter: Payer: BLUE CROSS/BLUE SHIELD | Admitting: Physical Therapy

## 2017-07-06 ENCOUNTER — Ambulatory Visit (INDEPENDENT_AMBULATORY_CARE_PROVIDER_SITE_OTHER): Payer: BLUE CROSS/BLUE SHIELD | Admitting: Sports Medicine

## 2017-07-06 ENCOUNTER — Encounter: Payer: Self-pay | Admitting: Sports Medicine

## 2017-07-06 DIAGNOSIS — M5416 Radiculopathy, lumbar region: Secondary | ICD-10-CM | POA: Diagnosis not present

## 2017-07-06 NOTE — Assessment & Plan Note (Signed)
Occasional right sided L3 and L4 radicular symptoms, he is doing extremely well with Celebrex, pain is approximately 70% better and continuing to improve, he is doing home rehab exercises now. We again discussed the pathophysiology and prognosis of lumbar degenerative disc disease, and I gave him some advice on ways to keep this from flaring, he can return to see me as needed, I can refill his Celebrex as needed as long as we keep visits at least once per year.

## 2017-07-06 NOTE — Progress Notes (Signed)
Subjective:    CC: Follow-up  HPI: Low back pain: With occasional radiculopathy, improved considerably with formal physical therapy, Celebrex, happy with how things are going, he has not yet plateaued and continues to improve.  I reviewed the past medical history, family history, social history, surgical history, and allergies today and no changes were needed.  Please see the problem list section below in epic for further details.  Past Medical History: Past Medical History:  Diagnosis Date  . Migraines    Past Surgical History: No past surgical history on file. Social History: Social History   Socioeconomic History  . Marital status: Single    Spouse name: Not on file  . Number of children: Not on file  . Years of education: Not on file  . Highest education level: Not on file  Occupational History  . Not on file  Social Needs  . Financial resource strain: Not on file  . Food insecurity:    Worry: Not on file    Inability: Not on file  . Transportation needs:    Medical: Not on file    Non-medical: Not on file  Tobacco Use  . Smoking status: Never Smoker  . Smokeless tobacco: Never Used  Substance and Sexual Activity  . Alcohol use: No    Frequency: Never  . Drug use: No  . Sexual activity: Not on file  Lifestyle  . Physical activity:    Days per week: Not on file    Minutes per session: Not on file  . Stress: Not on file  Relationships  . Social connections:    Talks on phone: Not on file    Gets together: Not on file    Attends religious service: Not on file    Active member of club or organization: Not on file    Attends meetings of clubs or organizations: Not on file    Relationship status: Not on file  Other Topics Concern  . Not on file  Social History Narrative  . Not on file   Family History: No family history on file. Allergies: No Known Allergies Medications: See med rec.  Review of Systems: No fevers, chills, night sweats, weight loss,  chest pain, or shortness of breath.   Objective:    General: Well Developed, well nourished, and in no acute distress.  Neuro: Alert and oriented x3, extra-ocular muscles intact, sensation grossly intact.  HEENT: Normocephalic, atraumatic, pupils equal round reactive to light, neck supple, no masses, no lymphadenopathy, thyroid nonpalpable.  Skin: Warm and dry, no rashes. Cardiac: Regular rate and rhythm, no murmurs rubs or gallops, no lower extremity edema.  Respiratory: Clear to auscultation bilaterally. Not using accessory muscles, speaking in full sentences.  Impression and Recommendations:    Lumbar radiculopathy Occasional right sided L3 and L4 radicular symptoms, he is doing extremely well with Celebrex, pain is approximately 70% better and continuing to improve, he is doing home rehab exercises now. We again discussed the pathophysiology and prognosis of lumbar degenerative disc disease, and I gave him some advice on ways to keep this from flaring, he can return to see me as needed, I can refill his Celebrex as needed as long as we keep visits at least once per year.  I spent 25 minutes with this patient, greater than 50% was face-to-face time counseling regarding the above diagnoses ___________________________________________ Ihor Austinhomas J. Benjamin Stainhekkekandam, M.D., ABFM., CAQSM. Primary Care and Sports Medicine [place] [facility] [place]  Adjunct Instructor of [facility] [employer] [employer]  [employer]

## 2017-08-11 DIAGNOSIS — H1013 Acute atopic conjunctivitis, bilateral: Secondary | ICD-10-CM | POA: Diagnosis not present

## 2017-08-11 DIAGNOSIS — L309 Dermatitis, unspecified: Secondary | ICD-10-CM | POA: Diagnosis not present

## 2017-12-06 DIAGNOSIS — R300 Dysuria: Secondary | ICD-10-CM | POA: Diagnosis not present

## 2018-05-13 DIAGNOSIS — T189XXA Foreign body of alimentary tract, part unspecified, initial encounter: Secondary | ICD-10-CM | POA: Diagnosis not present

## 2018-05-13 DIAGNOSIS — J029 Acute pharyngitis, unspecified: Secondary | ICD-10-CM | POA: Diagnosis not present

## 2018-05-13 DIAGNOSIS — X58XXXA Exposure to other specified factors, initial encounter: Secondary | ICD-10-CM | POA: Diagnosis not present

## 2018-05-13 DIAGNOSIS — R0989 Other specified symptoms and signs involving the circulatory and respiratory systems: Secondary | ICD-10-CM | POA: Diagnosis not present

## 2018-09-11 DIAGNOSIS — H6993 Unspecified Eustachian tube disorder, bilateral: Secondary | ICD-10-CM | POA: Diagnosis not present

## 2018-12-25 DIAGNOSIS — S29011A Strain of muscle and tendon of front wall of thorax, initial encounter: Secondary | ICD-10-CM | POA: Diagnosis not present

## 2021-06-12 ENCOUNTER — Encounter (HOSPITAL_COMMUNITY): Payer: Self-pay

## 2021-06-12 ENCOUNTER — Emergency Department (HOSPITAL_COMMUNITY): Payer: BLUE CROSS/BLUE SHIELD

## 2021-06-12 ENCOUNTER — Emergency Department (HOSPITAL_COMMUNITY)
Admission: EM | Admit: 2021-06-12 | Discharge: 2021-06-12 | Disposition: A | Discharge status: Home / Self Care | Payer: BLUE CROSS/BLUE SHIELD | Attending: Emergency Medicine | Admitting: Emergency Medicine

## 2021-06-12 DIAGNOSIS — Z79899 Other long term (current) drug therapy: Secondary | ICD-10-CM | POA: Insufficient documentation

## 2021-06-12 DIAGNOSIS — R0789 Other chest pain: Secondary | ICD-10-CM | POA: Insufficient documentation

## 2021-06-12 DIAGNOSIS — R0602 Shortness of breath: Secondary | ICD-10-CM | POA: Insufficient documentation

## 2021-06-12 MED ORDER — OMEPRAZOLE 40 MG PO CPDR: 40.0000 mg | DELAYED_RELEASE_CAPSULE | Freq: Every day | ORAL | 0 refills | Status: AC

## 2021-06-12 MED ORDER — OMEPRAZOLE 40 MG PO CPDR: 40.0000 mg | DELAYED_RELEASE_CAPSULE | Freq: Every day | ORAL | 0 refills | Status: DC

## 2021-06-12 NOTE — ED Provider Notes (Signed)
?[facility]-EMERGENCY DEPT ?Provider Note ? ? ?CSN: [id number] ?Arrival date & time: 06/12/21  3154 ? ?  ? ?History ? ?No chief complaint on file. ? ? ?Dustin Rodgers is a 41 y.o. male. ? ?41 year old male with prior medical history as detailed below presents for evaluation. ? ?Patient reports that he was attempting to get a chest x-ray at a [facility] outpatient facility.  [facility] advised the patient that they were closed today.  He was referred to the ED for his chest x-ray. ? ?Patient reports recent ongoing issues with atypical chest discomfort.  Patient was evaluated by Dr. Vivien Rossetti (Cardiology) with [facility] on February 21.  Patient was deemed to be very low risk for possible ACS.  Patient's work-up was to include echocardiogram, chest x-ray, Holter monitoring, and blood work. ? ?Patient reports that he is currently comfortable without current chest pain or shortness of breath. ? ?Patient admits that he would not have come to the ED of the imaging facility was open today.  ? ?The history is provided by the patient and medical records. A language interpreter was used.  ? ?  ? ?Home Medications ?Prior to Admission medications   ?Medication Sig Start Date End Date Taking? Authorizing Provider  ?omeprazole (PRILOSEC) 40 MG capsule Take 1 capsule (40 mg total) by mouth daily. 06/12/21  Yes Wynetta Fines, MD  ?celecoxib (CELEBREX) 200 MG capsule One to 2 tablets by mouth daily as needed for pain. 05/17/17   Monica Becton, MD  ?SUMAtriptan (IMITREX) 100 MG tablet TAKE ONE TABLET BY MOUTH EVERY 2 HOURS maximum TWO in 24 HOURS 03/18/17   [provider]  ?   ? ?Allergies    ?Patient has no known allergies.   ? ?Review of Systems   ?Review of Systems  ?All other systems reviewed and are negative. ? ?Physical Exam ?Updated Vital Signs ?BP 122/69   Pulse (!) 58   Temp 98 ?F (36.7 ?C) (Oral)   Resp 15   SpO2 99%  ?Physical Exam ?Vitals and nursing note  reviewed.  ?Constitutional:   ?   General: He is not in acute distress. ?   Appearance: Normal appearance. He is well-developed.  ?HENT:  ?   Head: Normocephalic and atraumatic.  ?Eyes:  ?   Conjunctiva/sclera: Conjunctivae normal.  ?   Pupils: Pupils are equal, round, and reactive to light.  ?Cardiovascular:  ?   Rate and Rhythm: Normal rate and regular rhythm.  ?   Heart sounds: Normal heart sounds.  ?Pulmonary:  ?   Effort: Pulmonary effort is normal. No respiratory distress.  ?   Breath sounds: Normal breath sounds.  ?Abdominal:  ?   General: There is no distension.  ?   Palpations: Abdomen is soft.  ?   Tenderness: There is no abdominal tenderness.  ?Musculoskeletal:     ?   General: No deformity. Normal range of motion.  ?   Cervical back: Normal range of motion and neck supple.  ?Skin: ?   General: Skin is warm and dry.  ?Neurological:  ?   General: No focal deficit present.  ?   Mental Status: He is alert and oriented to person, place, and time.  ? ? ?ED Results / Procedures / Treatments   ?Labs ?(all labs ordered are listed, but only abnormal results are displayed) ?Labs Reviewed - No data to display ? ?EKG ?EKG Interpretation ? ?Date/Time:  Friday June 12 2021 10:20:42 EST ?  Ventricular Rate:  53 ?PR Interval:  158 ?QRS Duration: 109 ?QT Interval:  418 ?QTC Calculation: 393 ?R Axis:   105 ?Text Interpretation: Sinus rhythm Consider left atrial enlargement Right axis deviation ST elev, probable normal early repol pattern Confirmed by Kristine Royal [phone]) on 06/12/2021 10:27:28 AM ? ?Radiology ?DG Chest 2 View ? ?Result Date: 06/12/2021 ?CLINICAL DATA:  Shortness of breath. Chest pain radiating to the back. EXAM: CHEST - 2 VIEW COMPARISON:  None. FINDINGS: Cardiac silhouette and mediastinal contours are within normal limits. The lungs are clear. No pleural effusion or pneumothorax. No acute skeletal abnormality. IMPRESSION: No active cardiopulmonary disease. Electronically Signed   By: Neita Garnet M.D.   On:  06/12/2021 10:22   ? ?Procedures ?Procedures  ? ? ?Medications Ordered in ED ?Medications - No data to display ? ?ED Course/ Medical Decision Making/ A&P ?  ?                        ?Medical Decision Making ?Amount and/or Complexity of Data Reviewed ?Radiology: ordered. ? ?Risk ?Prescription drug management. ? ? ? ?Medical Screen Complete ? ?This patient presented to the ED with complaint of request for chest x-ray. ? ?This complaint involves an extensive number of treatment options. The initial differential diagnosis includes, but is not limited to, intrathoracic pathology, etc ? ?This presentation is: Acute, Self-Limited, Previously Undiagnosed, Uncertain Prognosis, and Complicated ? ?Patient recently seen in the clinic by cardiology with [facility].  Patient was being seen for atypical chest discomfort.  Work-up was to include chest x-ray.  Patient was unable to obtain his outpatient chest x-ray as ordered.  Patient was advised by staff at the outpatient facility to "just come to the ED" for chest x-ray. ? ?Patient is without current chest pain or other acute complaint. ? ?Chest x-ray obtained is without significant acute pathology. ? ?Obtained EKG is without significant acute pathology. ? ?Patient does have already established follow-up plan with Dr. Vivien Rossetti with [facility].  ? ?Patient is apparently out of omeprazole - refill provided.  ? ?Additional history obtained: ? ?External records from outside sources obtained and reviewed including prior ED visits and prior Inpatient records.  ? ?Imaging Studies ordered: ? ?I ordered imaging studies including CXR  ?I independently visualized and interpreted obtained imaging which showed NAD ?I agree with the radiologist interpretation. ? ? ?Cardiac Monitoring: ? ?The patient was maintained on a cardiac monitor.  I personally viewed and interpreted the cardiac monitor which showed an underlying rhythm of: NSR ? ? ?Problem List / ED Course: ? ?Request for Chest  XRay ? ? ?Reevaluation: ? ?After the interventions noted above, I reevaluated the patient and found that they have: resolved ? ? ?Disposition: ? ?After consideration of the diagnostic results and the patients response to treatment, I feel that the patent would benefit from close outpatient followup.  ? ? ? ? ? ? ? ? ?Final Clinical Impression(s) / ED Diagnoses ?Final diagnoses:  ?Atypical chest pain  ? ? ?Rx / DC Orders ?ED Discharge Orders   ? ?      Ordered  ?  omeprazole (PRILOSEC) 40 MG capsule  Daily       ? 06/12/21 1122  ? ?  ?  ? ?  ? ? ?  ?Wynetta Fines, MD ?06/12/21 1132 ? ?

## 2021-06-12 NOTE — ED Triage Notes (Signed)
Pt arrived via POV, states he went to see cardiology and was told he needed a chest x ray. Pt states he has been having pain in chest, radiating to back as well as dizzy spells and was referred to cardiology by PCP. Pt states pain is still present, but not as bad. States he was sent by cardiology for chest xray, but location was closed, staff there told pt he could come to ed for chest xray. Denies any SOB or dizziness at this time. States cont burning in chest that is unchanged from previous.  ? ? ? ? ?All information obtained using translation services.  ?

## 2021-06-12 NOTE — Discharge Instructions (Addendum)
Follow up closely with DR. SALIASHVILLI (Cardiology) as previously arranged. ? ?Your x-ray obtained today is without significant pathology. ? ?EKG obtained today is without evidence of significant abnormality. ? ?Please take Omeprazole (Prilosec) as prescribed.  ?

## 2022-04-22 ENCOUNTER — Encounter (HOSPITAL_COMMUNITY): Payer: Self-pay | Admitting: Family Medicine

## 2022-04-27 ENCOUNTER — Ambulatory Visit
Admission: RE | Admit: 2022-04-27 | Discharge: 2022-04-27 | Disposition: A | Discharge status: Home / Self Care | Payer: Self-pay | Source: Ambulatory Visit | Attending: Family Medicine | Admitting: Family Medicine

## 2022-04-27 ENCOUNTER — Other Ambulatory Visit: Payer: Self-pay | Admitting: Family Medicine

## 2022-04-27 DIAGNOSIS — K5900 Constipation, unspecified: Secondary | ICD-10-CM

## 2022-07-03 IMAGING — CR DG CHEST 2V
2 series · 2 of 2 positions shown · non-contrast
Comparison: None.

CLINICAL DATA: Shortness of breath. Chest pain radiating to the
back.

EXAM:
CHEST - 2 VIEW

[w chest pa]
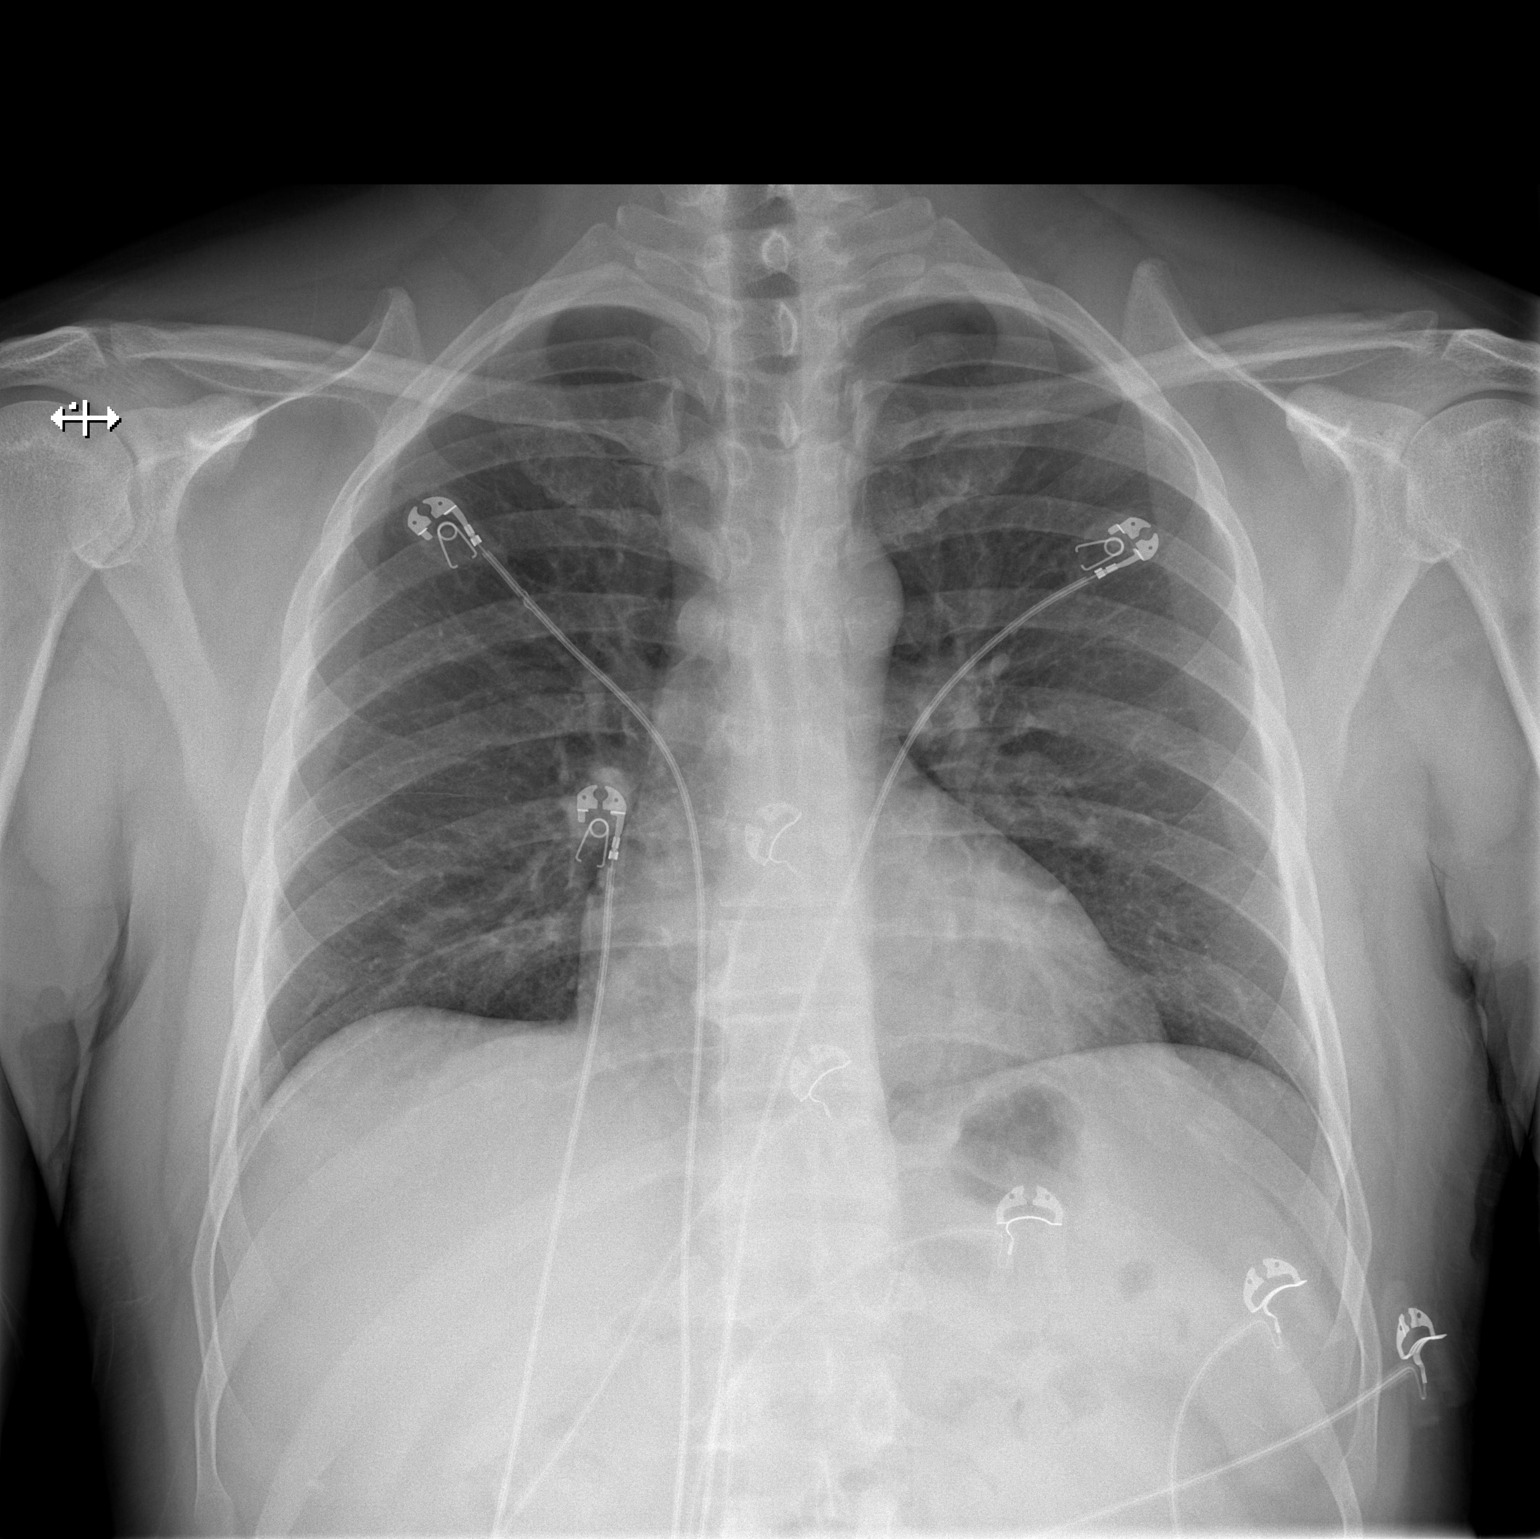

[w chest lat]
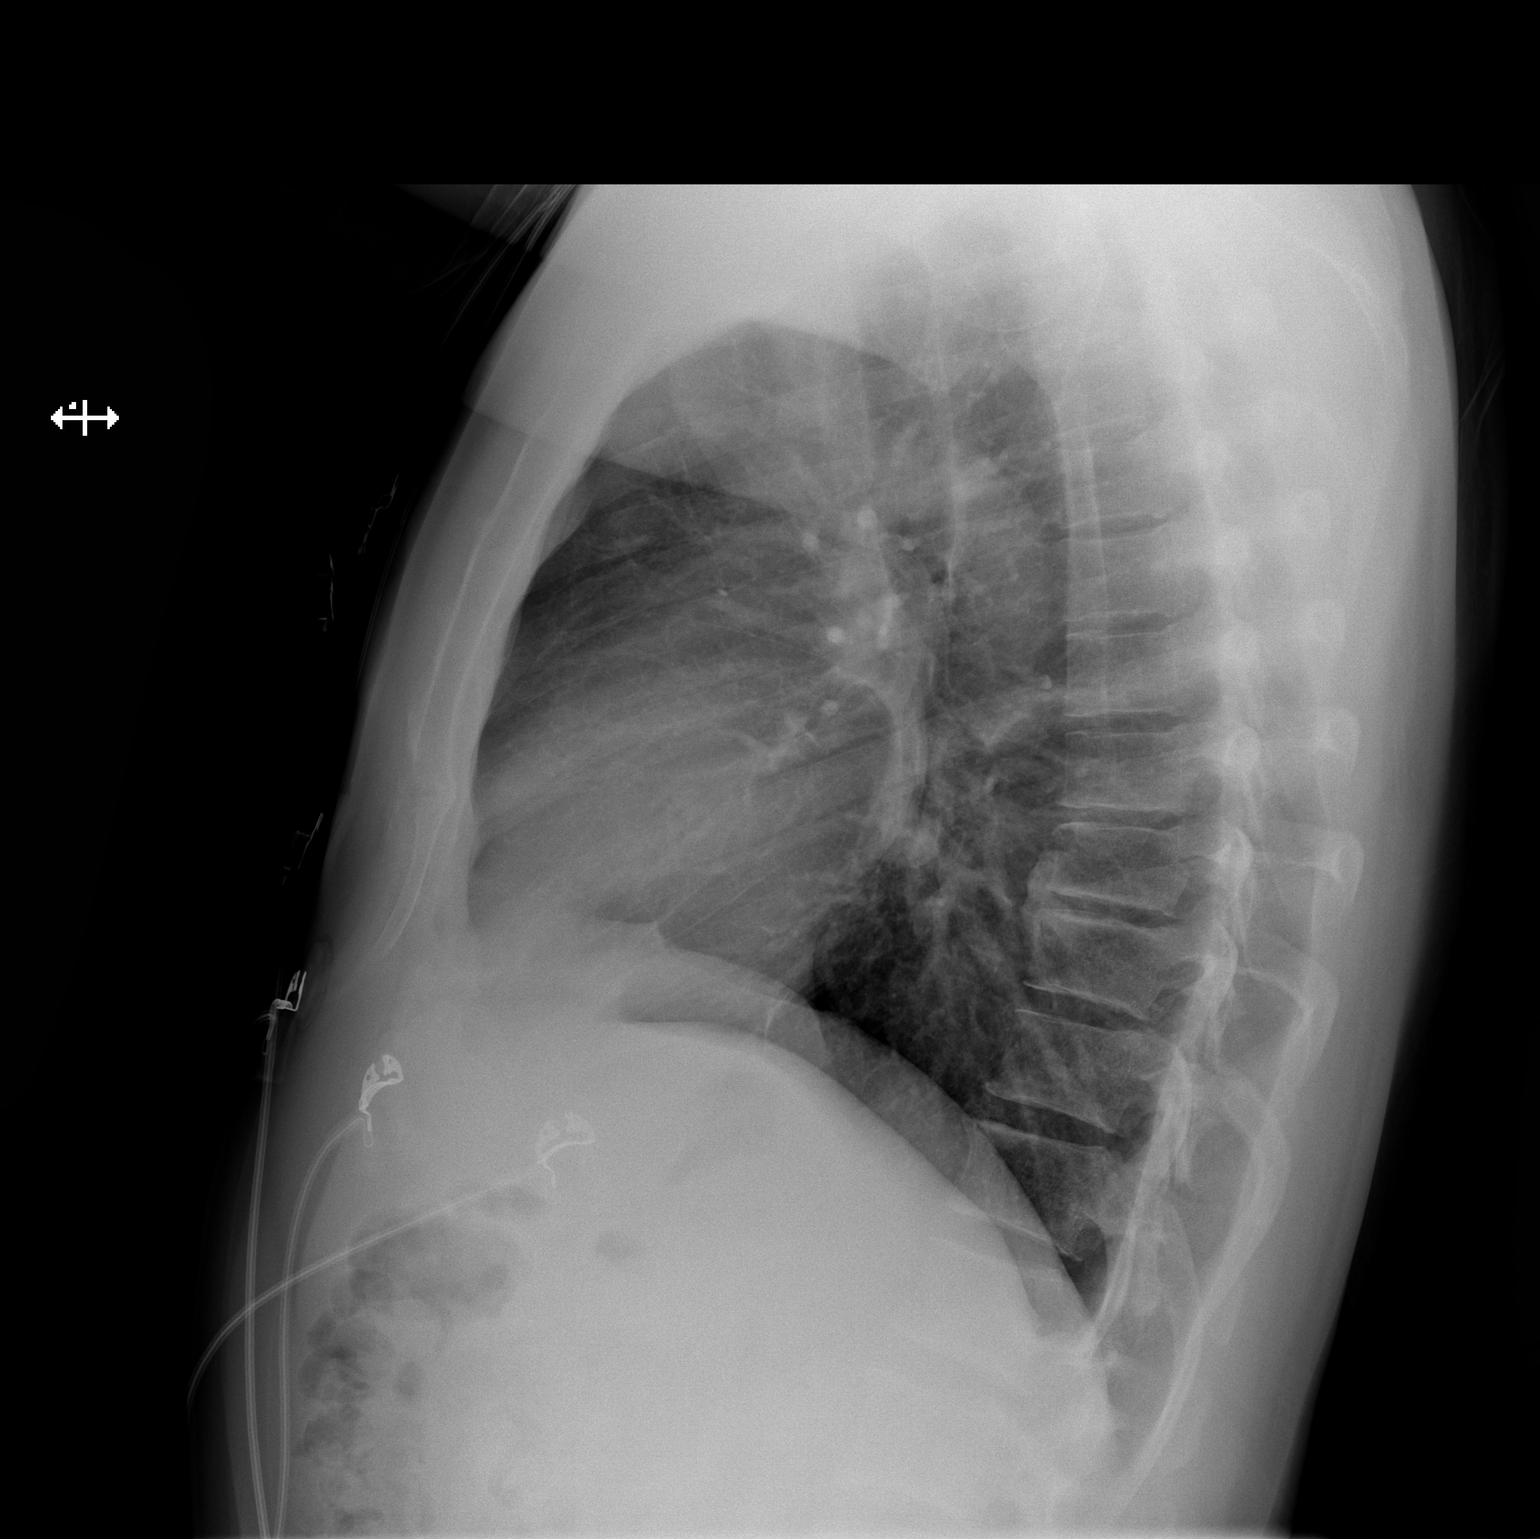

[2 of 2 positions shown; findings below may reference images not displayed]

FINDINGS: Cardiac silhouette and mediastinal contours are within normal
limits. The lungs are clear. No pleural effusion or pneumothorax. No
acute skeletal abnormality.
IMPRESSION: No active cardiopulmonary disease.

## 2024-01-21 ENCOUNTER — Other Ambulatory Visit: Payer: Self-pay

## 2024-01-21 ENCOUNTER — Emergency Department
Admission: EM | Admit: 2024-01-21 | Discharge: 2024-01-21 | Disposition: A | Discharge status: Home / Self Care | Payer: Self-pay | Attending: Emergency Medicine | Admitting: Emergency Medicine

## 2024-01-21 ENCOUNTER — Emergency Department: Payer: Self-pay

## 2024-01-21 DIAGNOSIS — H9202 Otalgia, left ear: Secondary | ICD-10-CM | POA: Insufficient documentation

## 2024-01-21 DIAGNOSIS — J069 Acute upper respiratory infection, unspecified: Secondary | ICD-10-CM | POA: Insufficient documentation

## 2024-01-21 LAB — CBC
HCT: 44.9 % (ref 39.0–52.0)
Hemoglobin: 15.7 g/dL (ref 13.0–17.0)
MCH: 31.7 pg (ref 26.0–34.0)
MCHC: 35 g/dL (ref 30.0–36.0)
MCV: 90.5 fL (ref 80.0–100.0)
Platelets: 183 K/uL (ref 150–400)
RBC: 4.96 MIL/uL (ref 4.22–5.81)
RDW: 11.9 % (ref 11.5–15.5)
WBC: 5.8 K/uL (ref 4.0–10.5)
nRBC: 0 % (ref 0.0–0.2)

## 2024-01-21 LAB — RESP PANEL BY RT-PCR (RSV, FLU A&B, COVID)  RVPGX2
Influenza A by PCR: NEGATIVE
Influenza B by PCR: NEGATIVE
Resp Syncytial Virus by PCR: NEGATIVE
SARS Coronavirus 2 by RT PCR: NEGATIVE

## 2024-01-21 LAB — BASIC METABOLIC PANEL WITH GFR
Anion gap: 9 (ref 5–15)
BUN: 12 mg/dL (ref 6–20)
CO2: 21 mmol/L — ABNORMAL LOW (ref 22–32)
Calcium: 8.8 mg/dL — ABNORMAL LOW (ref 8.9–10.3)
Chloride: 107 mmol/L (ref 98–111)
Creatinine, Ser: 0.77 mg/dL (ref 0.61–1.24)
GFR, Estimated: >60 mL/min (ref >60)
Glucose, Bld: 157 mg/dL — ABNORMAL HIGH (ref 70–99)
Potassium: 3.6 mmol/L (ref 3.5–5.1)
Sodium: 137 mmol/L (ref 135–145)

## 2024-01-21 LAB — TROPONIN I (HIGH SENSITIVITY): Troponin I (High Sensitivity): <2 ng/L (ref <18)

## 2024-01-21 MED ORDER — ACETAMINOPHEN 500 MG PO TABS: 1000.0000 mg | ORAL_TABLET | Freq: Four times a day (QID) | ORAL | 0 refills | Status: AC | PRN

## 2024-01-21 MED ORDER — CEFDINIR 300 MG PO CAPS: 300.0000 mg | ORAL_CAPSULE | Freq: Two times a day (BID) | ORAL | 0 refills | Status: AC

## 2024-01-21 MED ORDER — CETIRIZINE HCL 10 MG PO TABS: 10.0000 mg | ORAL_TABLET | Freq: Every day | ORAL | 0 refills | Status: AC

## 2024-01-21 MED ORDER — IBUPROFEN 800 MG PO TABS
800.0000 mg | ORAL_TABLET | Freq: Once | ORAL | Status: AC
  Administered 2024-01-21: 800 mg via ORAL
  Filled 2024-01-21: qty 1

## 2024-01-21 MED ORDER — ACETAMINOPHEN 500 MG PO TABS
1000.0000 mg | ORAL_TABLET | Freq: Once | ORAL | Status: AC
  Administered 2024-01-21: 1000 mg via ORAL
  Filled 2024-01-21: qty 2

## 2024-01-21 MED ORDER — IBUPROFEN 800 MG PO TABS: 800.0000 mg | ORAL_TABLET | Freq: Four times a day (QID) | ORAL | 0 refills | Status: AC | PRN

## 2024-01-21 MED ORDER — FLUTICASONE PROPIONATE 50 MCG/ACT NA SUSP: 1.0000 | Freq: Every day | NASAL | 0 refills | Status: AC

## 2024-01-21 NOTE — ED Triage Notes (Addendum)
 Pt arrives POV, ambulatory to triage, gait steady.Left ear pain, dry cough, and fever x 4 days; currently on amoxicillin prescribed by pcp  Woke up this morning feeling disoriented, SOB, and trenched in sweat. Pt reports having fever and taking ibuprofen prior to going to sleep. Pt is having more sob. Pt states he thinks having a fever for four days is too long and he feels worse than common cold; feels very weak. Denies chest pain. NADN.SABRA pt last took ibuprofen @ 1800 yesterday

## 2024-01-21 NOTE — ED Provider Notes (Signed)
 [facility] Provider Note    Event Date/Time   First MD Initiated Contact with Patient 01/21/24 [phone]     (approximate)   History   Weakness, Cough, Shortness of Breath, and Fever   HPI  Dustin Rodgers is a 43 y.o. male with no significant past medical history who presents to the emergency department with complaints of fevers, cough, congestion, headache, chills for the past 4 days.  Also has had left ear pain for 2 weeks.  Recently started on Augmentin 3 days ago and has been taking 400 mg of ibuprofen every 8 hours with minimal relief.  Denies vomiting, diarrhea, chest pain or shortness of breath.  No neck pain or neck stiffness.    History provided by patient, friend using Spanish interpreter.    Past Medical History:  Diagnosis Date   Migraines     No past surgical history on file.  MEDICATIONS:  Prior to Admission medications   Medication Sig Start Date End Date Taking? Authorizing Provider  celecoxib  (CELEBREX ) 200 MG capsule One to 2 tablets by mouth daily as needed for pain. 05/17/17   Curtis Debby PARAS, MD  omeprazole  (PRILOSEC) 40 MG capsule Take 1 capsule (40 mg total) by mouth daily. 06/12/21   Laurice Maude BROCKS, MD  SUMAtriptan (IMITREX) 100 MG tablet TAKE ONE TABLET BY MOUTH EVERY 2 HOURS maximum TWO in 24 HOURS 03/18/17   [provider]    Physical Exam   Triage Vital Signs: ED Triage Vitals  Encounter Vitals Group     BP 01/21/24 0403 (!) 131/90     Girls Systolic BP Percentile --      Girls Diastolic BP Percentile --      Boys Systolic BP Percentile --      Boys Diastolic BP Percentile --      Pulse Rate 01/21/24 0403 68     Resp 01/21/24 0403 20     Temp 01/21/24 0403 98.6 F (37 C)     Temp Source 01/21/24 0403 Oral     SpO2 01/21/24 0403 97 %     Weight --      Height --      Head Circumference --      Peak Flow --      Pain Score 01/21/24 0401 0     Pain Loc --      Pain Education --       Exclude from Growth Chart --     Most recent vital signs: Vitals:   01/21/24 0555 01/21/24 0645  BP: (!) 121/93 123/89  Pulse: 65 63  Resp:  14  Temp:    SpO2: 98% 97%    CONSTITUTIONAL: Alert, responds appropriately to questions. Well-appearing; well-nourished, nontoxic, afebrile HEAD: Normocephalic, atraumatic EYES: Conjunctivae clear, pupils appear equal, sclera nonicteric ENT: normal nose; moist mucous membranes; No pharyngeal erythema or petechiae, no tonsillar hypertrophy or exudate, no uvular deviation, no unilateral swelling in posterior oropharynx, no trismus or drooling, no muffled voice, normal phonation, no stridor, airway patent.  TMs are clear bilaterally without erythema, purulence, bulging, perforation, effusion.  No cerumen impaction or sign of foreign body in the external auditory canal. No inflammation, erythema or drainage from the external auditory canal. No signs of mastoiditis. No pain with manipulation of the pinna bilaterally. NECK: Supple, normal ROM, no meningismus CARD: RRR; S1 and S2 appreciated RESP: Normal chest excursion without splinting or tachypnea; breath sounds clear and equal bilaterally; no wheezes, no rhonchi, no  rales, no hypoxia or respiratory distress, speaking full sentences ABD/GI: Non-distended; soft, non-tender, no rebound, no guarding, no peritoneal signs BACK: The back appears normal EXT: Normal ROM in all joints; no deformity noted, no edema SKIN: Normal color for age and race; warm; no rash on exposed skin NEURO: Moves all extremities equally, normal speech PSYCH: The patient's mood and manner are appropriate.   ED Results / Procedures / Treatments   LABS: (all labs ordered are listed, but only abnormal results are displayed) Labs Reviewed  BASIC METABOLIC PANEL WITH GFR - Abnormal; Notable for the following components:      Result Value   CO2 21 (*)    Glucose, Bld 157 (*)    Calcium 8.8 (*)    All other components within  normal limits  RESP PANEL BY RT-PCR (RSV, FLU A&B, COVID)  [id number]  CBC  TROPONIN I (HIGH SENSITIVITY)     EKG:  EKG Interpretation Date/Time:  Saturday January 21 2024 04:07:46 EDT Ventricular Rate:  74 PR Interval:  144 QRS Duration:  110 QT Interval:  384 QTC Calculation: 426 R Axis:   106  Text Interpretation: Normal sinus rhythm Rightward axis Borderline ECG When compared with ECG of 12-Jun-2021 10:20, PREVIOUS ECG IS PRESENT Confirmed by Neomi Neptune [phone]) on 01/21/2024 6:16:47 AM         RADIOLOGY: My personal review and interpretation of imaging: Chest x-ray clear.  I have personally reviewed all radiology reports.   DG Chest 2 View Result Date: 01/21/2024 EXAM: 2 VIEW(S) XRAY OF THE CHEST 01/21/2024 04:36:50 AM COMPARISON: Chest radiographs 06/12/2021. CLINICAL HISTORY: 43 year old male with SOB, dry cough, fever, disorientation, and sweating for 4 days. FINDINGS: LUNGS AND PLEURA: Mildly lower lung volumes. No focal pulmonary opacity. No pulmonary edema. No pleural effusion. No pneumothorax. HEART AND MEDIASTINUM: No acute abnormality of the cardiac and mediastinal silhouettes. BONES AND SOFT TISSUES: No acute osseous abnormality. IMPRESSION: 1. No acute cardiopulmonary abnormality. Electronically signed by: Helayne Hurst MD 01/21/2024 04:43 AM EDT RP Workstation: [id number]     PROCEDURES:  Critical Care performed: No      Procedures    IMPRESSION / MDM / ASSESSMENT AND PLAN / ED COURSE  I reviewed the triage vital signs and the nursing notes.    Patient here with complaints of ear pain x 2 weeks, fevers, cough, congestion, headache.  The patient is on the cardiac monitor to evaluate for evidence of arrhythmia and/or significant heart rate changes.   DIFFERENTIAL DIAGNOSIS (includes but not limited to):   Viral URI, pneumonia, no signs clinically of otitis media, otitis externa, mastoiditis, doubt meningitis, sepsis   Patient's presentation is  most consistent with acute complicated illness / injury requiring diagnostic workup.   PLAN: Labs obtained from triage show normal hemoglobin, electrolytes, negative troponin.  EKG nonischemic without arrhythmia.  Chest x-ray reviewed and interpreted by myself and the radiologist and shows no pneumonia.  Patient is extremely well-appearing here, nontoxic with no meningismus and normal vital signs.  Low suspicion for pneumonia, meningitis, bacteremia, sepsis.  He complains of significant left-sided ear pain and reports having fevers at home.  He is on Augmentin but does not feel that is helping him.  We discussed potentially starting him on cefdinir instead and he would like to proceed with this.  We also discussed that the dose of ibuprofen that he is taking is half of the recommended dose for an adult.  Recommended that he increase his ibuprofen to 800 mg  every 6 hours as needed and alternate this with Tylenol 1000 mg every 6 hours as needed.  Will provide him with prescriptions.  He has no signs of mastoiditis, perichondritis, otitis externa on exam.  Have recommended ENT follow-up if symptoms or not improving with antibiotics.  Will also start him on Flonase, Zyrtec to help manage symptoms.  Patient verbalized understanding and is comfortable with this plan.   MEDICATIONS GIVEN IN ED: Medications  acetaminophen (TYLENOL) tablet 1,000 mg (1,000 mg Oral Given 01/21/24 0639)  ibuprofen (ADVIL) tablet 800 mg (800 mg Oral Given 01/21/24 9360)     ED COURSE:  At this time, I do not feel there is any life-threatening condition present. I reviewed all nursing notes, vitals, pertinent previous records.  All lab and urine results, EKGs, imaging ordered have been independently reviewed and interpreted by myself.  I reviewed all available radiology reports from any imaging ordered this visit.  Based on my assessment, I feel the patient is safe to be discharged home without further emergent workup and can  continue workup as an outpatient as needed. Discussed all findings, treatment plan as well as usual and customary return precautions.  They verbalize understanding and are comfortable with this plan.  Outpatient follow-up has been provided as needed.  All questions have been answered.    CONSULTS:  none   OUTSIDE RECORDS REVIEWED: Reviewed cardiology notes in 2023.       FINAL CLINICAL IMPRESSION(S) / ED DIAGNOSES   Final diagnoses:  Viral URI with cough  Left ear pain     Rx / DC Orders   ED Discharge Orders          Ordered    Ambulatory Referral to Primary Care (Establish Care)        01/21/24 0621    cefdinir (OMNICEF) 300 MG capsule  2 times daily        01/21/24 0624    acetaminophen (TYLENOL) 500 MG tablet  Every 6 hours PRN        01/21/24 0624    ibuprofen (ADVIL) 800 MG tablet  Every 6 hours PRN        01/21/24 0624    fluticasone (FLONASE) 50 MCG/ACT nasal spray  Daily        01/21/24 0630    cetirizine (ZYRTEC ALLERGY) 10 MG tablet  Daily        01/21/24 0630             Note:  This document was prepared using Dragon voice recognition software and may include unintentional dictation errors.   Demonte Dobratz, Josette SAILOR, DO 01/21/24 [phone]

## 2024-01-21 NOTE — Discharge Instructions (Addendum)
 You may alternate over the counter Tylenol 1000 mg every 6 hours as needed for pain, fever and Ibuprofen 800 mg every 6-8 hours as needed for pain, fever.  Please take Ibuprofen with food.  Do not take more than 4000 mg of Tylenol (acetaminophen) in a 24 hour period.  Please stop your current prescriptions for Ibuprofen and Augmentin and begin new prescriptions this morning.  Puede alternar Tylenol de venta libre de 1000 mg cada 6 horas, segn sea necesario, para el dolor y la fiebre, e ibuprofeno de 800 mg cada 6-8 horas, segn sea necesario, para el dolor y Insurance account manager. Tome el ibuprofeno con alimentos. No tome ms de 4000 mg de Tylenol (paracetamol) en un perodo de 24 horas.  Suspenda sus recetas actuales de ibuprofeno y Augmentin y comience con las nuevas esta maana.
